# Patient Record
Sex: Female | Born: 2000 | Race: Black or African American | Hispanic: No | Marital: Single | Smoking: Former smoker
Health system: Southern US, Community
[De-identification: ages and names within clinical notes are randomized; demographics above are authoritative.]

## PROBLEM LIST (undated history)

## (undated) ENCOUNTER — Emergency Department (HOSPITAL_BASED_OUTPATIENT_CLINIC_OR_DEPARTMENT_OTHER): Admission: EM | Payer: BC Managed Care – PPO

## (undated) DIAGNOSIS — J020 Streptococcal pharyngitis: Secondary | ICD-10-CM

---
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
## 2021-03-03 DIAGNOSIS — R253 Fasciculation: Secondary | ICD-10-CM | POA: Diagnosis not present

## 2021-03-03 DIAGNOSIS — R000 Tachycardia, unspecified: Secondary | ICD-10-CM | POA: Diagnosis not present

## 2021-03-03 DIAGNOSIS — R0689 Other abnormalities of breathing: Secondary | ICD-10-CM | POA: Diagnosis not present

## 2021-03-03 DIAGNOSIS — R072 Precordial pain: Secondary | ICD-10-CM | POA: Diagnosis not present

## 2021-03-04 DIAGNOSIS — I498 Other specified cardiac arrhythmias: Secondary | ICD-10-CM | POA: Diagnosis not present

## 2021-03-11 ENCOUNTER — Emergency Department (HOSPITAL_BASED_OUTPATIENT_CLINIC_OR_DEPARTMENT_OTHER)
Admission: EM | Admit: 2021-03-11 | Discharge: 2021-03-11 | Disposition: A | Discharge status: Home / Self Care | Payer: BC Managed Care – PPO | Attending: Emergency Medicine | Admitting: Emergency Medicine

## 2021-03-11 ENCOUNTER — Other Ambulatory Visit: Payer: Self-pay

## 2021-03-11 ENCOUNTER — Emergency Department (HOSPITAL_BASED_OUTPATIENT_CLINIC_OR_DEPARTMENT_OTHER): Payer: BC Managed Care – PPO

## 2021-03-11 ENCOUNTER — Encounter (HOSPITAL_BASED_OUTPATIENT_CLINIC_OR_DEPARTMENT_OTHER): Payer: Self-pay | Admitting: *Deleted

## 2021-03-11 DIAGNOSIS — Y92410 Unspecified street and highway as the place of occurrence of the external cause: Secondary | ICD-10-CM | POA: Insufficient documentation

## 2021-03-11 DIAGNOSIS — S29011A Strain of muscle and tendon of front wall of thorax, initial encounter: Secondary | ICD-10-CM | POA: Diagnosis not present

## 2021-03-11 DIAGNOSIS — S299XXA Unspecified injury of thorax, initial encounter: Secondary | ICD-10-CM | POA: Diagnosis not present

## 2021-03-11 DIAGNOSIS — S098XXA Other specified injuries of head, initial encounter: Secondary | ICD-10-CM | POA: Insufficient documentation

## 2021-03-11 DIAGNOSIS — S20219A Contusion of unspecified front wall of thorax, initial encounter: Secondary | ICD-10-CM

## 2021-03-11 DIAGNOSIS — F17210 Nicotine dependence, cigarettes, uncomplicated: Secondary | ICD-10-CM | POA: Diagnosis not present

## 2021-03-11 DIAGNOSIS — R079 Chest pain, unspecified: Secondary | ICD-10-CM | POA: Diagnosis not present

## 2021-03-11 DIAGNOSIS — T148XXA Other injury of unspecified body region, initial encounter: Secondary | ICD-10-CM

## 2021-03-11 DIAGNOSIS — M7918 Myalgia, other site: Secondary | ICD-10-CM

## 2021-03-11 MED ORDER — CYCLOBENZAPRINE HCL 10 MG PO TABS
10.0000 mg | ORAL_TABLET | Freq: Once | ORAL | Status: AC
  Administered 2021-03-11: 10 mg via ORAL
  Filled 2021-03-11: qty 1

## 2021-03-11 MED ORDER — CYCLOBENZAPRINE HCL 10 MG PO TABS: 10.0000 mg | ORAL_TABLET | Freq: Two times a day (BID) | ORAL | 0 refills | Status: DC | PRN
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
## 2021-03-11 NOTE — Discharge Instructions (Addendum)
Recommend 1000 mg of Tylenol every 6 hours as needed for pain.  Recommend 800 mg ibuprofen every 8 hours as needed for pain.  Take muscle relaxant as prescribed but do not mix this with alcohol or drugs or driving or other dangerous activities as this medicine is sedating.  Overall suspect that you have muscle spasms and bone bruise.  No fracture on chest x-ray.

## 2021-03-23 ENCOUNTER — Other Ambulatory Visit: Payer: Self-pay

## 2021-03-23 ENCOUNTER — Emergency Department (HOSPITAL_BASED_OUTPATIENT_CLINIC_OR_DEPARTMENT_OTHER)
Admission: EM | Admit: 2021-03-23 | Discharge: 2021-03-23 | Disposition: A | Discharge status: Home / Self Care | Payer: BC Managed Care – PPO | Attending: Emergency Medicine | Admitting: Emergency Medicine

## 2021-03-23 ENCOUNTER — Encounter (HOSPITAL_BASED_OUTPATIENT_CLINIC_OR_DEPARTMENT_OTHER): Payer: Self-pay

## 2021-03-23 DIAGNOSIS — R509 Fever, unspecified: Secondary | ICD-10-CM | POA: Diagnosis not present

## 2021-03-23 DIAGNOSIS — J039 Acute tonsillitis, unspecified: Secondary | ICD-10-CM | POA: Diagnosis not present

## 2021-03-23 DIAGNOSIS — R000 Tachycardia, unspecified: Secondary | ICD-10-CM | POA: Insufficient documentation

## 2021-03-23 DIAGNOSIS — F17290 Nicotine dependence, other tobacco product, uncomplicated: Secondary | ICD-10-CM | POA: Insufficient documentation

## 2021-03-23 LAB — GROUP A STREP BY PCR: Group A Strep by PCR: NOT DETECTED

## 2021-03-23 MED ORDER — IBUPROFEN 100 MG/5ML PO SUSP
800.0000 mg | Freq: Once | ORAL | Status: AC
  Administered 2021-03-23: 800 mg via ORAL
  Filled 2021-03-23: qty 40

## 2021-03-23 MED ORDER — CLINDAMYCIN HCL 150 MG PO CAPS
300.0000 mg | ORAL_CAPSULE | Freq: Once | ORAL | Status: AC
  Administered 2021-03-23: 300 mg via ORAL
  Filled 2021-03-23: qty 2

## 2021-03-23 MED ORDER — LIDOCAINE VISCOUS HCL 2 % MT SOLN
15.0000 mL | Freq: Once | OROMUCOSAL | Status: AC
  Administered 2021-03-23: 15 mL via OROMUCOSAL
  Filled 2021-03-23: qty 15

## 2021-03-23 MED ORDER — DEXAMETHASONE SODIUM PHOSPHATE 10 MG/ML IJ SOLN
10.0000 mg | Freq: Once | INTRAMUSCULAR | Status: AC
  Administered 2021-03-23: 10 mg
  Filled 2021-03-23: qty 1

## 2021-03-23 MED ORDER — CLINDAMYCIN HCL 300 MG PO CAPS: 300.0000 mg | ORAL_CAPSULE | Freq: Three times a day (TID) | ORAL | 0 refills | Status: AC

## 2021-03-23 NOTE — Discharge Instructions (Addendum)
Your strep test today is negative. Home to rest and push hydrating fluids. Take Motrin and Tylenol as needed as directed. Take clindamycin as prescribed. Return to emergency room for worsening or concerning symptoms. Recheck with your doctor if symptoms or not improving.  Consider mono test on day 10 if not improving.

## 2021-03-23 NOTE — ED Provider Notes (Signed)
[facility] EMERGENCY DEPARTMENT Provider Note   CSN: [id number] Arrival date & time: 03/23/21  1747     History Chief Complaint  Patient presents with   Generalized Body Aches   Sore Throat    Robin Marshall is a 20 y.o. female.  20 year old female with complaint of sore throat with fever and tender cervical LNs onset yesterday. Denies cough, congestion, headache, nausea, vomiting. No known sick contacts. No other complaints or concerns.       Past Medical History:  Diagnosis Date   Strep pharyngitis     There are no problems to display for this patient.   History reviewed. No pertinent surgical history.   OB History   No obstetric history on file.     No family history on file.  Social History   Tobacco Use   Smoking status: Every Day    Types: E-cigarettes   Smokeless tobacco: Never  Vaping Use   Vaping Use: Every day  Substance Use Topics   Alcohol use: Yes    Comment: occ   Drug use: Yes    Types: Marijuana    Comment: last use 07/22/20    Home Medications Prior to Admission medications   Medication Sig Start Date End Date Taking? Authorizing Provider  clindamycin (CLEOCIN) 300 MG capsule Take 1 capsule (300 mg total) by mouth 3 (three) times daily for 7 days. 03/23/21 03/30/21 Yes Jeannie Fend, PA-C  albuterol (VENTOLIN HFA) 108 (90 Base) MCG/ACT inhaler Inhale 2 puffs into the lungs every 4 (four) hours as needed for wheezing or shortness of breath. 07/25/20   Molpus, John, MD  cyclobenzaprine (FLEXERIL) 10 MG tablet Take 1 tablet (10 mg total) by mouth 2 (two) times daily as needed for muscle spasms. 03/11/21   Curatolo, Adam, DO  naproxen (NAPROSYN) 500 MG tablet Take 1 tablet twice daily as needed for fever or body aches. 07/25/20   Molpus, John, MD  diphenhydrAMINE (BENADRYL) 25 MG tablet Take 1 tablet (25 mg total) by mouth every 6 (six) hours. 10/12/16 07/25/20  Emi Holes, PA-C    Allergies    Patient has no known  allergies.  Review of Systems   Review of Systems  Constitutional:  Positive for chills and fever.  HENT:  Positive for sore throat and voice change. Negative for congestion and trouble swallowing.   Respiratory:  Negative for cough.   Gastrointestinal:  Negative for nausea and vomiting.  Musculoskeletal:  Negative for arthralgias, myalgias, neck pain and neck stiffness.  Skin:  Negative for rash and wound.  Allergic/Immunologic: Negative for immunocompromised state.  Neurological:  Negative for headaches.  Hematological:  Positive for adenopathy.  All other systems reviewed and are negative.  Physical Exam Updated Vital Signs BP 106/69 (BP Location: Left Arm)   Pulse 88   Temp 98.4 F (36.9 C) (Oral)   Resp 16   Wt 71.7 kg   LMP 03/03/2021   SpO2 100%   BMI 28.90 kg/m   Physical Exam Vitals and nursing note reviewed.  Constitutional:      General: She is not in acute distress.    Appearance: She is well-developed. She is not diaphoretic.  HENT:     Head: Normocephalic and atraumatic.     Jaw: No trismus.     Right Ear: Tympanic membrane and ear canal normal.     Left Ear: Tympanic membrane and ear canal normal.     Nose: No congestion.  Mouth/Throat:     Mouth: Mucous membranes are moist.     Pharynx: Uvula midline. No uvula swelling.     Tonsils: Tonsillar exudate present. No tonsillar abscesses. 2+ on the right. 2+ on the left.  Cardiovascular:     Rate and Rhythm: Regular rhythm. Tachycardia present.     Heart sounds: Normal heart sounds.  Pulmonary:     Effort: Pulmonary effort is normal.     Breath sounds: Normal breath sounds.  Lymphadenopathy:     Cervical: Cervical adenopathy present.  Skin:    General: Skin is warm and dry.     Findings: No erythema or rash.  Neurological:     Mental Status: She is alert and oriented to person, place, and time.  Psychiatric:        Behavior: Behavior normal.    ED Results / Procedures / Treatments   Labs (all  labs ordered are listed, but only abnormal results are displayed) Labs Reviewed  GROUP A STREP BY PCR    EKG None  Radiology No results found.  Procedures Procedures   Medications Ordered in ED Medications  ibuprofen (ADVIL) 100 MG/5ML suspension 800 mg (800 mg Oral Given 03/23/21 1804)  dexamethasone (DECADRON) injection 10 mg (10 mg Other Given 03/23/21 1854)  lidocaine (XYLOCAINE) 2 % viscous mouth solution 15 mL (15 mLs Mouth/Throat Given 03/23/21 1854)  clindamycin (CLEOCIN) capsule 300 mg (300 mg Oral Given 03/23/21 2055)    ED Course  I have reviewed the triage vital signs and the nursing notes.  Pertinent labs & imaging results that were available during my care of the patient were reviewed by me and considered in my medical decision making (see chart for details).  Clinical Course as of 03/23/21 2106  Tue Mar 23, 2021  6752 20 year old female with sore throat, fever, tender anterior cervical LNs. Tonsils enlarged with exudate. Uvula midline. Does not appear to have PTA. Lungs CTA. Given motrin for fever in triage. Decadron and viscous lidocaine ordered. Strep negative. Plan is to recheck, if pain improved, likely dc with antibiotics for tonsillits.  [LM]  2103 Vital signs improved, no longer febrile or tachycardic.  Symptoms have improved.  Given first dose of clindamycin in the ED, advised to recheck with PCP, consider mono test if not improving.  [LM]    Clinical Course User Index [LM] Alden Hipp   MDM Rules/Calculators/A&P                           Final Clinical Impression(s) / ED Diagnoses Final diagnoses:  Acute tonsillitis, unspecified etiology    Rx / DC Orders ED Discharge Orders          Ordered    clindamycin (CLEOCIN) 300 MG capsule  3 times daily        03/23/21 2043             Jeannie Fend, PA-C 03/23/21 2106    Terrilee Files, MD 03/24/21 1140

## 2021-03-23 NOTE — ED Triage Notes (Signed)
Pt c/o body aches, sore throat x 2 days-NAD-steady gait

## 2021-03-23 NOTE — ED Notes (Signed)
Patient given fluids with instructions to sip slowly.

## 2021-11-08 ENCOUNTER — Other Ambulatory Visit: Payer: Self-pay

## 2021-11-08 ENCOUNTER — Encounter (HOSPITAL_BASED_OUTPATIENT_CLINIC_OR_DEPARTMENT_OTHER): Payer: Self-pay

## 2021-11-08 ENCOUNTER — Emergency Department (HOSPITAL_BASED_OUTPATIENT_CLINIC_OR_DEPARTMENT_OTHER)
Admission: EM | Admit: 2021-11-08 | Discharge: 2021-11-08 | Disposition: A | Discharge status: Home / Self Care | Payer: BC Managed Care – PPO | Attending: Emergency Medicine | Admitting: Emergency Medicine

## 2021-11-08 DIAGNOSIS — R070 Pain in throat: Secondary | ICD-10-CM | POA: Diagnosis not present

## 2021-11-08 DIAGNOSIS — J029 Acute pharyngitis, unspecified: Secondary | ICD-10-CM | POA: Diagnosis not present

## 2021-11-08 LAB — GROUP A STREP BY PCR: Group A Strep by PCR: NOT DETECTED

## 2021-11-08 MED ORDER — DEXAMETHASONE 10 MG/ML FOR PEDIATRIC ORAL USE
10.0000 mg | Freq: Once | INTRAMUSCULAR | Status: AC
  Administered 2021-11-08: 10 mg via ORAL
  Filled 2021-11-08: qty 1

## 2021-11-08 MED ORDER — DEXAMETHASONE 1 MG/ML PO CONC
10.0000 mg | Freq: Once | ORAL | Status: DC
  Filled 2021-11-08: qty 10

## 2021-11-08 MED ORDER — LIDOCAINE VISCOUS HCL 2 % MT SOLN
15.0000 mL | Freq: Once | OROMUCOSAL | Status: AC
  Administered 2021-11-08: 15 mL via OROMUCOSAL
  Filled 2021-11-08: qty 15

## 2021-11-08 NOTE — ED Triage Notes (Signed)
Patient presents with complaint of sore throat x1 day. ?

## 2021-11-08 NOTE — ED Provider Notes (Signed)
? ?[facility] EMERGENCY DEPARTMENT  ?Provider Note ? ?CSN: [id number] ?Arrival date & time: 11/08/21 0445 ? ?History ?Chief Complaint  ?Patient presents with  ? Sore Throat  ? ? ?Robin Marshall is a 21 y.o. female reports two days of sore throat, worse with swallowing, associated with subjective fever at home. Hurts to talk. Arrived by EMS who report no difficulty handling secretions.  ? ? ?Home Medications ?Prior to Admission medications   ?Medication Sig Start Date End Date Taking? Authorizing Provider  ?albuterol (VENTOLIN HFA) 108 (90 Base) MCG/ACT inhaler Inhale 2 puffs into the lungs every 4 (four) hours as needed for wheezing or shortness of breath. 07/25/20   Molpus, John, MD  ?cyclobenzaprine (FLEXERIL) 10 MG tablet Take 1 tablet (10 mg total) by mouth 2 (two) times daily as needed for muscle spasms. 03/11/21   Curatolo, Adam, DO  ?naproxen (NAPROSYN) 500 MG tablet Take 1 tablet twice daily as needed for fever or body aches. 07/25/20   Molpus, John, MD  ?diphenhydrAMINE (BENADRYL) 25 MG tablet Take 1 tablet (25 mg total) by mouth every 6 (six) hours. 10/12/16 07/25/20  Emi Holes, PA-C  ? ? ? ?Allergies    ?Patient has no known allergies. ? ? ?Review of Systems   ?Review of Systems ?Please see HPI for pertinent positives and negatives ? ?Physical Exam ?BP 122/79 (BP Location: Right Arm)   Pulse 99   Temp 98.4 ?F (36.9 ?C) (Oral)   Resp 16   Ht 5\' 2"  (1.575 m)   Wt 73.1 kg   LMP 10/21/2021 (Exact Date)   SpO2 100%   BMI 29.47 kg/m?  ? ?Physical Exam ?Vitals and nursing note reviewed.  ?HENT:  ?   Head: Normocephalic.  ?   Nose: Nose normal.  ?   Mouth/Throat:  ?   Mouth: Mucous membranes are moist.  ?   Pharynx: Uvula midline. Posterior oropharyngeal erythema present. No oropharyngeal exudate or uvula swelling.  ?   Tonsils: No tonsillar exudate or tonsillar abscesses. 2+ on the right. 2+ on the left.  ?Eyes:  ?   Extraocular Movements: Extraocular movements intact.  ?Pulmonary:  ?   Effort:  Pulmonary effort is normal.  ?Musculoskeletal:     ?   General: Normal range of motion.  ?   Cervical back: Neck supple.  ?Skin: ?   Findings: No rash (on exposed skin).  ?Neurological:  ?   Mental Status: She is alert and oriented to person, place, and time.  ?Psychiatric:     ?   Mood and Affect: Mood normal.  ? ? ?ED Results / Procedures / Treatments   ?EKG ?None ? ?Procedures ?Procedures ? ?Medications Ordered in the ED ?Medications  ?dexamethasone (DECADRON) 1 MG/ML solution 10 mg (has no administration in time range)  ?lidocaine (XYLOCAINE) 2 % viscous mouth solution 15 mL (15 mLs Mouth/Throat Given 11/08/21 0458)  ? ? ?Initial Impression and Plan ? Patient here with sore throat. No acute distress. Not febrile. No clinical signs of abscess. Strep PCR pending. Lidocaine for discomfort.  ? ?ED Course  ? ?Clinical Course as of 11/08/21 0535  ?Mon Nov 08, 2021  ?0531 Strep is negative. Plan decadron for swelling. No indication for antibiotics. Symptomatic care at home. PCP follow up.  [CS]  ?  ?Clinical Course User Index ?[CS] Nov 10, 2021, MD  ? ? ? ?MDM Rules/Calculators/A&P ?Medical Decision Making ?Problems Addressed: ?Pharyngitis, unspecified etiology: acute illness or injury ? ?Risk ?Prescription drug management. ? ? ? ?  Final Clinical Impression(s) / ED Diagnoses ?Final diagnoses:  ?Pharyngitis, unspecified etiology  ? ? ?Rx / DC Orders ?ED Discharge Orders   ? ? None  ? ?  ? ?  ?Robin Savoy, MD ?11/08/21 [phone] ? ?

## 2021-11-08 NOTE — ED Notes (Signed)
Patient discharged to home.  All discharge instructions reviewed.  Patient verbalized understanding via teachback method.  VS WDL.  Respirations even and unlabored.  Ambulatory out of ED.   °

## 2021-12-22 ENCOUNTER — Encounter (HOSPITAL_BASED_OUTPATIENT_CLINIC_OR_DEPARTMENT_OTHER): Payer: Self-pay

## 2021-12-22 ENCOUNTER — Emergency Department (HOSPITAL_BASED_OUTPATIENT_CLINIC_OR_DEPARTMENT_OTHER)
Admission: EM | Admit: 2021-12-22 | Discharge: 2021-12-22 | Disposition: A | Discharge status: Home / Self Care | Payer: BC Managed Care – PPO | Attending: Emergency Medicine | Admitting: Emergency Medicine

## 2021-12-22 DIAGNOSIS — N760 Acute vaginitis: Secondary | ICD-10-CM | POA: Insufficient documentation

## 2021-12-22 DIAGNOSIS — N764 Abscess of vulva: Secondary | ICD-10-CM | POA: Diagnosis not present

## 2021-12-22 DIAGNOSIS — Z202 Contact with and (suspected) exposure to infections with a predominantly sexual mode of transmission: Secondary | ICD-10-CM | POA: Insufficient documentation

## 2021-12-22 DIAGNOSIS — N3000 Acute cystitis without hematuria: Secondary | ICD-10-CM | POA: Insufficient documentation

## 2021-12-22 DIAGNOSIS — D72829 Elevated white blood cell count, unspecified: Secondary | ICD-10-CM | POA: Diagnosis not present

## 2021-12-22 DIAGNOSIS — B9689 Other specified bacterial agents as the cause of diseases classified elsewhere: Secondary | ICD-10-CM | POA: Insufficient documentation

## 2021-12-22 DIAGNOSIS — F17290 Nicotine dependence, other tobacco product, uncomplicated: Secondary | ICD-10-CM | POA: Diagnosis not present

## 2021-12-22 DIAGNOSIS — L0291 Cutaneous abscess, unspecified: Secondary | ICD-10-CM

## 2021-12-22 LAB — CBC WITH DIFFERENTIAL/PLATELET
Abs Immature Granulocytes: 0.06 10*3/uL (ref 0.00–0.07)
Basophils Absolute: 0 10*3/uL (ref 0.0–0.1)
Basophils Relative: 0 %
Eosinophils Absolute: 0.1 10*3/uL (ref 0.0–0.5)
Eosinophils Relative: 1 %
HCT: 31.4 % — ABNORMAL LOW (ref 36.0–46.0)
Hemoglobin: 9.1 g/dL — ABNORMAL LOW (ref 12.0–15.0)
Immature Granulocytes: 1 %
Lymphocytes Relative: 21 %
Lymphs Abs: 2.8 10*3/uL (ref 0.7–4.0)
MCH: 22.1 pg — ABNORMAL LOW (ref 26.0–34.0)
MCHC: 29 g/dL — ABNORMAL LOW (ref 30.0–36.0)
MCV: 76.4 fL — ABNORMAL LOW (ref 80.0–100.0)
Monocytes Absolute: 1 10*3/uL (ref 0.1–1.0)
Monocytes Relative: 8 %
Neutro Abs: 9.1 10*3/uL — ABNORMAL HIGH (ref 1.7–7.7)
Neutrophils Relative %: 69 %
Platelets: 435 10*3/uL — ABNORMAL HIGH (ref 150–400)
RBC: 4.11 MIL/uL (ref 3.87–5.11)
RDW: 18.5 % — ABNORMAL HIGH (ref 11.5–15.5)
WBC: 13 10*3/uL — ABNORMAL HIGH (ref 4.0–10.5)
nRBC: 0 % (ref 0.0–0.2)

## 2021-12-22 LAB — HCG, QUANTITATIVE, PREGNANCY: hCG, Beta Chain, Quant, S: <1 m[IU]/mL (ref <5)

## 2021-12-22 LAB — URINALYSIS, MICROSCOPIC (REFLEX)

## 2021-12-22 LAB — PREGNANCY, URINE: Preg Test, Ur: NEGATIVE

## 2021-12-22 LAB — WET PREP, GENITAL
Sperm: NONE SEEN
Trich, Wet Prep: NONE SEEN
WBC, Wet Prep HPF POC: >=10 — AB (ref <10)
Yeast Wet Prep HPF POC: NONE SEEN

## 2021-12-22 LAB — URINALYSIS, ROUTINE W REFLEX MICROSCOPIC
Bilirubin Urine: NEGATIVE
Glucose, UA: NEGATIVE mg/dL
Hgb urine dipstick: NEGATIVE
Ketones, ur: NEGATIVE mg/dL
Nitrite: POSITIVE — AB
Protein, ur: 30 mg/dL — AB
Specific Gravity, Urine: 1.02 (ref 1.005–1.030)
pH: 7 (ref 5.0–8.0)

## 2021-12-22 LAB — COMPREHENSIVE METABOLIC PANEL
ALT: 21 U/L (ref 0–44)
AST: 30 U/L (ref 15–41)
Albumin: 4 g/dL (ref 3.5–5.0)
Alkaline Phosphatase: 73 U/L (ref 38–126)
Anion gap: 9 (ref 5–15)
BUN: 8 mg/dL (ref 6–20)
CO2: 26 mmol/L (ref 22–32)
Calcium: 10.4 mg/dL — ABNORMAL HIGH (ref 8.9–10.3)
Chloride: 101 mmol/L (ref 98–111)
Creatinine, Ser: 0.9 mg/dL (ref 0.44–1.00)
GFR, Estimated: >60 mL/min (ref >60)
Glucose, Bld: 97 mg/dL (ref 70–99)
Potassium: 3.5 mmol/L (ref 3.5–5.1)
Sodium: 136 mmol/L (ref 135–145)
Total Bilirubin: 0.5 mg/dL (ref 0.3–1.2)
Total Protein: 8.8 g/dL — ABNORMAL HIGH (ref 6.5–8.1)

## 2021-12-22 MED ORDER — CEPHALEXIN 500 MG PO CAPS
500.0000 mg | ORAL_CAPSULE | Freq: Four times a day (QID) | ORAL | 0 refills | Status: DC
Start: 2021-12-22 — End: 2022-07-26

## 2021-12-22 MED ORDER — METRONIDAZOLE 500 MG PO TABS: 500.0000 mg | ORAL_TABLET | Freq: Two times a day (BID) | ORAL | 0 refills | Status: DC

## 2021-12-22 MED ORDER — LIDOCAINE HCL (PF) 1 % IJ SOLN
5.0000 mL | Freq: Once | INTRAMUSCULAR | Status: DC
  Filled 2021-12-22: qty 5

## 2021-12-22 MED ORDER — CEFTRIAXONE SODIUM 500 MG IJ SOLR
500.0000 mg | Freq: Once | INTRAMUSCULAR | Status: AC
  Administered 2021-12-22: 500 mg via INTRAMUSCULAR
  Filled 2021-12-22: qty 500

## 2021-12-22 MED ORDER — DOXYCYCLINE HYCLATE 100 MG PO CAPS
100.0000 mg | ORAL_CAPSULE | Freq: Two times a day (BID) | ORAL | 0 refills | Status: DC
Start: 2021-12-22 — End: 2022-07-26

## 2021-12-22 NOTE — ED Notes (Signed)
Swollen small marble size lump to rt labia, pt states it was small and then she pricked it with a needle yesterday , now more swollen and tender

## 2021-12-22 NOTE — ED Notes (Signed)
Pt A&OX4 ambulatory at d/c with independent steady gait 

## 2021-12-22 NOTE — ED Provider Notes (Signed)
[facility] EMERGENCY DEPARTMENT Provider Note   CSN: [id number] Arrival date & time: 12/22/21  1601     History  Chief Complaint  Patient presents with   Abscess    Robin Marshall is a 21 y.o. female.   Abscess Associated symptoms: no fever and no vomiting    21 year old female presents emergency department with complaints of abscess on her "right vagina lip."  She said she noticed getting ingrown hairs in this area after shaving, but they have never gotten this bad before. She first noticed this area develop about 5 days ago.   It is painful to the touch and with certain movement..  She also is concerned about potential STD exposure with chlamydia specifically in mind and request she be tested for everything although she is having no noticeable vaginal discharge at the moment. She wants to be tested for everything sexually transmittable. She currently does not follow with an OBGYN or PCP. Denies fever, chills, night sweats, chest pain, shortness of breath, abdominal pain, N/V/D, urinary/other vaginal symptoms, change in bowel habits.    Home Medications Prior to Admission medications   Medication Sig Start Date End Date Taking? Authorizing Provider  cephALEXin (KEFLEX) 500 MG capsule Take 1 capsule (500 mg total) by mouth 4 (four) times daily. 12/22/21  Yes Sherian Maroon A, PA  doxycycline (VIBRAMYCIN) 100 MG capsule Take 1 capsule (100 mg total) by mouth 2 (two) times daily. 12/22/21  Yes Sherian Maroon A, PA  metroNIDAZOLE (FLAGYL) 500 MG tablet Take 1 tablet (500 mg total) by mouth 2 (two) times daily. 12/22/21  Yes Sherian Maroon A, PA  albuterol (VENTOLIN HFA) 108 (90 Base) MCG/ACT inhaler Inhale 2 puffs into the lungs every 4 (four) hours as needed for wheezing or shortness of breath. 07/25/20   Molpus, John, MD  cyclobenzaprine (FLEXERIL) 10 MG tablet Take 1 tablet (10 mg total) by mouth 2 (two) times daily as needed for muscle spasms. 03/11/21   Curatolo, Adam, DO   naproxen (NAPROSYN) 500 MG tablet Take 1 tablet twice daily as needed for fever or body aches. 07/25/20   Molpus, John, MD  diphenhydrAMINE (BENADRYL) 25 MG tablet Take 1 tablet (25 mg total) by mouth every 6 (six) hours. 10/12/16 07/25/20  Emi Holes, PA-C      Allergies    Patient has no known allergies.    Review of Systems   Review of Systems  Constitutional:  Negative for chills and fever.  HENT:  Negative for ear pain and sore throat.   Eyes:  Negative for pain and visual disturbance.  Respiratory:  Negative for cough and shortness of breath.   Cardiovascular:  Negative for chest pain and palpitations.  Gastrointestinal:  Negative for abdominal pain and vomiting.  Genitourinary:  Positive for genital sores and vaginal pain. Negative for difficulty urinating, dyspareunia, dysuria, flank pain, frequency, hematuria, menstrual problem, pelvic pain, urgency, vaginal bleeding and vaginal discharge.  Musculoskeletal:  Negative for arthralgias and back pain.  Skin:  Negative for color change and rash.  Neurological:  Negative for seizures and syncope.  All other systems reviewed and are negative.  Physical Exam Updated Vital Signs BP 98/62   Pulse 99   Temp 99.8 F (37.7 C) (Oral)   Resp 17   Ht  (1.575 m)   LMP 12/19/2021   SpO2 100%   BMI 29.47 kg/m  Physical Exam Vitals and nursing note reviewed. Exam conducted with a chaperone present.  Constitutional:  General: She is not in acute distress.    Appearance: Normal appearance. She is well-developed and normal weight. She is not ill-appearing.  HENT:     Head: Normocephalic and atraumatic.     Mouth/Throat:     Mouth: Mucous membranes are moist.     Pharynx: Oropharynx is clear.  Eyes:     General:        Right eye: No discharge.        Left eye: No discharge.     Extraocular Movements: Extraocular movements intact.     Conjunctiva/sclera: Conjunctivae normal.  Cardiovascular:     Rate and Rhythm: Normal  rate and regular rhythm.     Pulses: Normal pulses.     Heart sounds: Normal heart sounds. No murmur heard. Pulmonary:     Effort: Pulmonary effort is normal. No respiratory distress.     Breath sounds: Normal breath sounds. No wheezing.  Abdominal:     General: Abdomen is flat.     Palpations: Abdomen is soft.     Tenderness: There is no abdominal tenderness. There is no right CVA tenderness, left CVA tenderness or guarding.  Genitourinary:    Exam position: Lithotomy position.     Labia:        Right: Tenderness present. No rash.      Vagina: No signs of injury and foreign body. Vaginal discharge present. No erythema, bleeding or lesions.     Cervix: Discharge present. No cervical motion tenderness, friability, erythema, cervical bleeding or eversion.     Uterus: Normal. Not tender.      Adnexa: Right adnexa normal and left adnexa normal.       Right: No tenderness.         Left: No tenderness.         Comments: R external labia show 2.5cm x 3.5cm area of fluctuance. No surrounding erythema noted. TTP of area noted. No obvious signs of drainage from site.  White thin discharge noted in vaginal vault with thicker white discharge from cervical os. No CMT or adnexal tenderness. No bleeding noted on exam.    Musculoskeletal:        General: No swelling. Normal range of motion.     Cervical back: Neck supple.     Right lower leg: No edema.     Left lower leg: No edema.  Skin:    General: Skin is warm and dry.     Capillary Refill: Capillary refill takes less than 2 seconds.  Neurological:     General: No focal deficit present.     Mental Status: She is alert and oriented to person, place, and time.  Psychiatric:        Mood and Affect: Mood normal.        Behavior: Behavior normal.    ED Results / Procedures / Treatments   Labs (all labs ordered are listed, but only abnormal results are displayed) Labs Reviewed  WET PREP, GENITAL - Abnormal; Notable for the following  components:      Result Value   Clue Cells Wet Prep HPF POC PRESENT (*)    WBC, Wet Prep HPF POC >=10 (*)    All other components within normal limits  URINALYSIS, ROUTINE W REFLEX MICROSCOPIC - Abnormal; Notable for the following components:   APPearance HAZY (*)    Protein, ur 30 (*)    Nitrite POSITIVE (*)    Leukocytes,Ua SMALL (*)    All other components within normal limits  CBC WITH DIFFERENTIAL/PLATELET - Abnormal; Notable for the following components:   WBC 13.0 (*)    Hemoglobin 9.1 (*)    HCT 31.4 (*)    MCV 76.4 (*)    MCH 22.1 (*)    MCHC 29.0 (*)    RDW 18.5 (*)    Platelets 435 (*)    Neutro Abs 9.1 (*)    All other components within normal limits  COMPREHENSIVE METABOLIC PANEL - Abnormal; Notable for the following components:   Calcium 10.4 (*)    Total Protein 8.8 (*)    All other components within normal limits  URINALYSIS, MICROSCOPIC (REFLEX) - Abnormal; Notable for the following components:   Bacteria, UA MANY (*)    All other components within normal limits  HIV ANTIBODY (ROUTINE TESTING W REFLEX)  HCG, QUANTITATIVE, PREGNANCY  PREGNANCY, URINE  GC/CHLAMYDIA PROBE AMP ([place]) NOT AT [facility]    EKG None  Radiology No results found.  Procedures .Marland KitchenIncision and Drainage  Date/Time: 12/23/2021 1:21 PM Performed by: Peter Garter, PA Authorized by: Peter Garter, PA   Consent:    Consent obtained:  Verbal   Consent given by:  Patient   Risks, benefits, and alternatives were discussed: yes     Risks discussed:  Bleeding, incomplete drainage, pain, infection and damage to other organs   Alternatives discussed:  No treatment, delayed treatment, referral and alternative treatment Universal protocol:    Procedure explained and questions answered to patient or proxy's satisfaction: yes     Patient identity confirmed:  Verbally with patient Location:    Type:  Abscess   Location:  Anogenital   Anogenital location:  Vulva Pre-procedure  details:    Skin preparation:  Chlorhexidine with alcohol Sedation:    Sedation type:  None Anesthesia:    Anesthesia method:  Local infiltration   Local anesthetic:  Lidocaine 1% w/o epi Procedure type:    Complexity:  Simple Procedure details:    Ultrasound guidance: no     Needle aspiration: no     Incision types:  Single straight   Wound management:  Probed and deloculated and irrigated with saline   Drainage:  Purulent and bloody   Drainage amount:  Copious   Wound treatment:  Wound left open   Packing materials:  None Post-procedure details:    Procedure completion:  Tolerated well, no immediate complications    Medications Ordered in ED Medications  cefTRIAXone (ROCEPHIN) injection 500 mg (500 mg Intramuscular Given 12/22/21 1932)    ED Course/ Medical Decision Making/ A&P                           Medical Decision Making Amount and/or Complexity of Data Reviewed Labs: ordered.  Risk Prescription drug management.   This patient presents to the ED for concern of abscess, this involves an extensive number of treatment options, and is a complaint that carries with it a high risk of complications and morbidity.  The differential diagnosis includes abscess, STD, UTI, pregnancy, PID,   Co morbidities that complicate the patient evaluation  Unprotected sex with possible STD exposure     Lab Tests:  I Ordered, and personally interpreted labs.  The pertinent results include:   UA significant for UTI w/ nitrites, small leukocytes, many bacteria Wet prep indicative of BV WBC of 13 w/o left shift with Hgb of 9.1   Imaging Studies ordered:  N/a   Cardiac Monitoring: / EKG:  The patient was maintained on a cardiac monitor.  I personally viewed and interpreted the cardiac monitored which showed an underlying rhythm of: sinus rhythm   Consultations Obtained:  N/a   Problem List / ED Course / Critical interventions / Medication management  Chlamydia  exposure I ordered medication including rocephin  for abx therapy  Reevaluation of the patient after these medicines showed that the patient improved I have reviewed the patients home medicines and have made adjustments as needed   Social Determinants of Health:  E-cigarette user   Test / Admission - Considered:  BV/UTI/Abscess/Chlamydia exposure Vitals signs within normal range and stable throughout visit. Laboratory/imaging studies significant for: UTI, BV, anemia, pending GC/Chlamydia Patient treated with rocephin in ED and given 7 days of doxycycline to take given chlamydial exposure. She was advised to follow mychart for results and can d/c this abx if negative Given flagyl to take for BV coverage and keflex for antibacterial coverage involving UTI/abscess.  Encouraged establishing care with OBGYN and PCP for further evaluation and treatment.  Worrisome signs and symptoms were discussed with the patient, and the patient acknowledged understanding to return to the ED if noticed. Patient was stable upon discharge.          Final Clinical Impression(s) / ED Diagnoses Final diagnoses:  Abscess  Acute cystitis without hematuria  Bacterial vaginosis  Exposure to chlamydia    Rx / DC Orders ED Discharge Orders          Ordered    metroNIDAZOLE (FLAGYL) 500 MG tablet  2 times daily        12/22/21 1924    doxycycline (VIBRAMYCIN) 100 MG capsule  2 times daily        12/22/21 1924    cephALEXin (KEFLEX) 500 MG capsule  4 times daily        12/22/21 1924              Peter Garter, [place] 12/23/21 1325    Franne Forts, DO 12/26/21 1831

## 2021-12-22 NOTE — ED Notes (Signed)
Chaperone for Pelvic exam and I&D of abscess to labia.  Pt tolerated well. Provider gave instructions for wound care

## 2021-12-22 NOTE — Discharge Instructions (Addendum)
Keep area where drainage was performed clean dry.  Wash with warm soapy water frequently.  Consider getting baby wipes or some other kind of leg you clean yourself every time use the bathroom to keep the area clean.  Keep area covered with Band-Aid or other type of bandaging.  Note that infection can recur in the area so please come back if you notice the signs and symptoms we talked about. He also had a urinary tract infection.  We will treat this with antibiotics. One of the swabs we did show positive for bacterial vaginosis.  We will treat this with antibiotics. We have treated you empirically for chlamydia as you expressed concern.  There are 3 antibiotics you should take:  1 is called metronidazole or Flagyl.  Take as directed for the next 7 days; it is for bacterial vaginosis. The second is called Keflex.  It is antibiotic for your urinary tract infection.  Take as directed for the next 5 days. The third antibiotic is called doxycycline.  You should take this twice a day for the next 7 days.  It is another antibiotic for chlamydia coverage. Follow your MyChart for your GC/chlamydia test results.  You can stop taking the doxycycline for GC/chlamydia results are negative.  Consider establishing care with OB/GYN for further gynecological issues.  Given the fact you are sexually active, establish care with a OB/GYN would be very beneficial. Please advise states return to emergency department for worrisome signs and symptoms we discussed become apparent.

## 2021-12-22 NOTE — ED Notes (Signed)
Pt verbalized understanding of d/c instructions, prescriptions and follow up care.

## 2021-12-22 NOTE — ED Triage Notes (Signed)
Pt reports abscess to right labia x5 days. Initially area small but picked with a needle and now is swollen and painful

## 2021-12-23 LAB — GC/CHLAMYDIA PROBE AMP (~~LOC~~) NOT AT ARMC
Chlamydia: POSITIVE — AB
Comment: NEGATIVE
Comment: NORMAL
Neisseria Gonorrhea: POSITIVE — AB

## 2021-12-23 LAB — HIV ANTIBODY (ROUTINE TESTING W REFLEX): HIV Screen 4th Generation wRfx: NONREACTIVE

## 2022-07-26 ENCOUNTER — Encounter (HOSPITAL_BASED_OUTPATIENT_CLINIC_OR_DEPARTMENT_OTHER): Payer: Self-pay | Admitting: Emergency Medicine

## 2022-07-26 ENCOUNTER — Emergency Department (HOSPITAL_BASED_OUTPATIENT_CLINIC_OR_DEPARTMENT_OTHER)
Admission: EM | Admit: 2022-07-26 | Discharge: 2022-07-26 | Disposition: A | Discharge status: Home / Self Care | Payer: BC Managed Care – PPO | Attending: Student | Admitting: Student

## 2022-07-26 ENCOUNTER — Other Ambulatory Visit: Payer: Self-pay

## 2022-07-26 DIAGNOSIS — N898 Other specified noninflammatory disorders of vagina: Secondary | ICD-10-CM | POA: Diagnosis not present

## 2022-07-26 DIAGNOSIS — N390 Urinary tract infection, site not specified: Secondary | ICD-10-CM | POA: Diagnosis not present

## 2022-07-26 DIAGNOSIS — A630 Anogenital (venereal) warts: Secondary | ICD-10-CM | POA: Insufficient documentation

## 2022-07-26 DIAGNOSIS — Z87891 Personal history of nicotine dependence: Secondary | ICD-10-CM | POA: Insufficient documentation

## 2022-07-26 LAB — URINALYSIS, MICROSCOPIC (REFLEX): WBC, UA: >50 WBC/hpf (ref 0–5)

## 2022-07-26 LAB — URINALYSIS, ROUTINE W REFLEX MICROSCOPIC
Bilirubin Urine: NEGATIVE
Glucose, UA: NEGATIVE mg/dL
Ketones, ur: NEGATIVE mg/dL
Nitrite: NEGATIVE
Protein, ur: NEGATIVE mg/dL
Specific Gravity, Urine: >=1.03 (ref 1.005–1.030)
pH: 6 (ref 5.0–8.0)

## 2022-07-26 LAB — WET PREP, GENITAL
Sperm: NONE SEEN
Trich, Wet Prep: NONE SEEN
WBC, Wet Prep HPF POC: >=10 — AB (ref <10)
Yeast Wet Prep HPF POC: NONE SEEN

## 2022-07-26 LAB — PREGNANCY, URINE: Preg Test, Ur: NEGATIVE

## 2022-07-26 LAB — RAPID HIV SCREEN (HIV 1/2 AB+AG)

## 2022-07-26 LAB — RPR: RPR Ser Ql: NONREACTIVE

## 2022-07-26 LAB — HIV ANTIBODY (ROUTINE TESTING W REFLEX): HIV Screen 4th Generation wRfx: NONREACTIVE

## 2022-07-26 MED ORDER — CEFADROXIL 500 MG PO CAPS: 500.0000 mg | ORAL_CAPSULE | Freq: Two times a day (BID) | ORAL | 0 refills | Status: AC

## 2022-07-26 MED ORDER — CEFTRIAXONE SODIUM 500 MG IJ SOLR
500.0000 mg | Freq: Once | INTRAMUSCULAR | Status: AC
  Administered 2022-07-26: 500 mg via INTRAMUSCULAR
  Filled 2022-07-26: qty 500

## 2022-07-26 MED ORDER — DOXYCYCLINE HYCLATE 100 MG PO CAPS: 100.0000 mg | ORAL_CAPSULE | Freq: Two times a day (BID) | ORAL | 0 refills | Status: DC

## 2022-07-26 MED ORDER — DOXYCYCLINE HYCLATE 100 MG PO TABS
100.0000 mg | ORAL_TABLET | Freq: Once | ORAL | Status: AC
  Administered 2022-07-26: 100 mg via ORAL
  Filled 2022-07-26: qty 1

## 2022-07-26 MED ORDER — IMIQUIMOD 5 % EX CREA
TOPICAL_CREAM | CUTANEOUS | 0 refills | Status: DC
Start: 2022-07-27 — End: 2022-08-18

## 2022-07-26 MED ORDER — LIDOCAINE HCL (PF) 1 % IJ SOLN
1.0000 mL | Freq: Once | INTRAMUSCULAR | Status: AC
  Administered 2022-07-26: 1 mL
  Filled 2022-07-26: qty 5

## 2022-07-26 NOTE — ED Notes (Signed)
Rx x 3 given  Written and verbal inst to pt  Verbalized an understanding  To home  

## 2022-07-26 NOTE — ED Provider Notes (Signed)
[facility] DEPARTMENT Provider Note  CSN: [id number] Arrival date & time: 07/26/22 3382  Chief Complaint(s) vaginal irritation   HPI Robin Marshall is a 22 y.o. female who presents emergency department for evaluation of vaginal irritation.  Patient states that she recently became sexually active with a new partner over the last few months and noticed a new rash over the labia minora on the right.  The rash is nonpainful but appears to have popped up overnight.  She denies nausea, vomiting, chest pain, shortness of breath or other systemic symptoms.  She does endorse some vaginal discharge and states that she is just finishing her menstrual cycle.   Past Medical History Past Medical History:  Diagnosis Date   Strep pharyngitis    There are no problems to display for this patient.  Home Medication(s) Prior to Admission medications   Medication Sig Start Date End Date Taking? Authorizing Provider  cefadroxil (DURICEF) 500 MG capsule Take 1 capsule (500 mg total) by mouth 2 (two) times daily for 7 days. 07/26/22 08/02/22 Yes Tienna Bienkowski, MD  doxycycline (VIBRAMYCIN) 100 MG capsule Take 1 capsule (100 mg total) by mouth 2 (two) times daily. 07/26/22  Yes Katricia Prehn, MD  imiquimod (ALDARA) 5 % cream Apply topically 3 (three) times a week. 07/27/22  Yes Breon Rehm, MD  albuterol (VENTOLIN HFA) 108 (90 Base) MCG/ACT inhaler Inhale 2 puffs into the lungs every 4 (four) hours as needed for wheezing or shortness of breath. 07/25/20   Molpus, John, MD  cyclobenzaprine (FLEXERIL) 10 MG tablet Take 1 tablet (10 mg total) by mouth 2 (two) times daily as needed for muscle spasms. 03/11/21   Curatolo, Adam, DO  naproxen (NAPROSYN) 500 MG tablet Take 1 tablet twice daily as needed for fever or body aches. 07/25/20   Molpus, John, MD  diphenhydrAMINE (BENADRYL) 25 MG tablet Take 1 tablet (25 mg total) by mouth every 6 (six) hours. 10/12/16 07/25/20  Frederica Kuster, PA-C                                                                                                                                     Past Surgical History History reviewed. No pertinent surgical history. Family History History reviewed. No pertinent family history.  Social History Social History   Tobacco Use   Smoking status: Former    Types: E-cigarettes   Smokeless tobacco: Never  Scientific laboratory technician Use: Former  Substance Use Topics   Alcohol use: Yes    Comment: occassionally   Drug use: Yes    Types: Marijuana   Allergies Patient has no known allergies.  Review of Systems Review of Systems  Genitourinary:  Positive for vaginal bleeding and vaginal discharge.  Skin:  Positive for rash.    Physical Exam Vital Signs  I have reviewed the triage vital signs BP 119/77 (BP Location: Right Arm)   Pulse 80  Temp 98.3 F (36.8 C) (Oral)   Resp 16   Ht 5\' 2"  (1.575 m)   Wt 76.2 kg   LMP 07/21/2022 (Exact Date)   SpO2 100%   BMI 30.73 kg/m   Physical Exam Vitals and nursing note reviewed.  Constitutional:      General: She is not in acute distress.    Appearance: She is well-developed.  HENT:     Head: Normocephalic and atraumatic.  Eyes:     Conjunctiva/sclera: Conjunctivae normal.  Cardiovascular:     Rate and Rhythm: Normal rate and regular rhythm.     Heart sounds: No murmur heard. Pulmonary:     Effort: Pulmonary effort is normal. No respiratory distress.     Breath sounds: Normal breath sounds.  Abdominal:     Palpations: Abdomen is soft.     Tenderness: There is no abdominal tenderness.  Genitourinary:    Comments: Condyloma acuminata seen in the perineum, nonpainful filiform like rash on the right labia minora, scant foul-smelling discharge and cervical friability in the vaginal canal Musculoskeletal:        General: No swelling.     Cervical back: Neck supple.  Skin:    General: Skin is warm and dry.     Capillary Refill: Capillary refill takes less than 2 seconds.   Neurological:     Mental Status: She is alert.  Psychiatric:        Mood and Affect: Mood normal.     ED Results and Treatments Labs (all labs ordered are listed, but only abnormal results are displayed) Labs Reviewed  WET PREP, GENITAL - Abnormal; Notable for the following components:      Result Value   Clue Cells Wet Prep HPF POC PRESENT (*)    WBC, Wet Prep HPF POC >=10 (*)    All other components within normal limits  URINALYSIS, ROUTINE W REFLEX MICROSCOPIC - Abnormal; Notable for the following components:   APPearance HAZY (*)    Hgb urine dipstick MODERATE (*)    Leukocytes,Ua LARGE (*)    All other components within normal limits  URINALYSIS, MICROSCOPIC (REFLEX) - Abnormal; Notable for the following components:   Bacteria, UA MANY (*)    All other components within normal limits  PREGNANCY, URINE  RAPID HIV SCREEN (HIV 1/2 AB+AG)  RPR  GC/CHLAMYDIA PROBE AMP ([place]) NOT AT [facility]                                                                                                                          Radiology No results found.  Pertinent labs & imaging results that were available during my care of the patient were reviewed by me and considered in my medical decision making (see MDM for details).  Medications Ordered in ED Medications  cefTRIAXone (ROCEPHIN) injection 500 mg (500 mg Intramuscular Given 07/26/22 0609)  lidocaine (PF) (XYLOCAINE) 1 % injection 1-2.1 mL (1 mL Other Given 07/26/22 0609)  doxycycline (VIBRA-TABS) tablet  100 mg (100 mg Oral Given 07/26/22 1610)                                                                                                                                     Procedures Procedures  (including critical care time)  Medical Decision Making / ED Course   This patient presents to the ED for concern of vaginal irritation and rash, this involves an extensive number of treatment options, and is a complaint that carries with  it a high risk of complications and morbidity.  The differential diagnosis includes STI, UTI, herpes, condyloma, syphilis  MDM: Patient seen the emergency room for evaluation of vaginal irritation and rash.  Physical exam with condyloma acuminata seen in the perineum and on the right sided labia minora.  On the anterior vaginal canal, patient has some scant foul-smelling discharge with cervical friability but no white discharge.  Wet prep is positive for clue cells and white blood cells but patient is discharged does not look like bacterial vaginosis and we will hold off on Flagyl administration at this time as the patient is generally asymptomatic from this standpoint.  Patient empirically treated for gonorrhea and chlamydia with ceftriaxone and doxycycline.  Urinalysis concerning for urinary tract infection and patient will be discharged with doxycycline for continued STI therapy and Duricef for UTI.  A prescription for imiquimod cream was sent to the pharmacy for her condyloma and I placed a referral to the OB/GYN's as the patient needs to establish care.  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  Patient then discharged with outpatient OB/GYN follow-up.   Additional history obtained:  -External records from outside source obtained and reviewed including: Chart review including previous notes, labs, imaging, consultation notes   Lab Tests: -I ordered, reviewed, and interpreted labs.   The pertinent results include:   Labs Reviewed  WET PREP, GENITAL - Abnormal; Notable for the following components:      Result Value   Clue Cells Wet Prep HPF POC PRESENT (*)    WBC, Wet Prep HPF POC >=10 (*)    All other components within normal limits  URINALYSIS, ROUTINE W REFLEX MICROSCOPIC - Abnormal; Notable for the following components:   APPearance HAZY (*)     Hgb urine dipstick MODERATE (*)    Leukocytes,Ua LARGE (*)    All other components within normal limits  URINALYSIS, MICROSCOPIC (REFLEX) - Abnormal; Notable for the following components:   Bacteria, UA MANY (*)    All other components within normal limits  PREGNANCY, URINE  RAPID HIV SCREEN (HIV 1/2 AB+AG)  RPR  GC/CHLAMYDIA PROBE AMP ([place]) NOT AT [facility]     Medicines ordered and prescription drug management: Meds ordered this encounter  Medications   cefTRIAXone (ROCEPHIN) injection 500  mg    Order Specific Question:   Antibiotic Indication:    Answer:   STD   lidocaine (PF) (XYLOCAINE) 1 % injection 1-2.1 mL   doxycycline (VIBRA-TABS) tablet 100 mg   imiquimod (ALDARA) 5 % cream    Sig: Apply topically 3 (three) times a week.    Dispense:  24 each    Refill:  0   doxycycline (VIBRAMYCIN) 100 MG capsule    Sig: Take 1 capsule (100 mg total) by mouth 2 (two) times daily.    Dispense:  20 capsule    Refill:  0   cefadroxil (DURICEF) 500 MG capsule    Sig: Take 1 capsule (500 mg total) by mouth 2 (two) times daily for 7 days.    Dispense:  14 capsule    Refill:  0    -I have reviewed the patients home medicines and have made adjustments as needed  Critical interventions none   Cardiac Monitoring: The patient was maintained on a cardiac monitor.  I personally viewed and interpreted the cardiac monitored which showed an underlying rhythm of: NSR  Social Determinants of Health:  Factors impacting patients care include: Sexually active with 1 female partner   Reevaluation: After the interventions noted above, I reevaluated the patient and found that they have :improved  Co morbidities that complicate the patient evaluation  Past Medical History:  Diagnosis Date   Strep pharyngitis       Dispostion: I considered admission for this patient, but at this time she does not meet inpatient criteria for admission and is safe for discharge with outpatient  follow-up     Final Clinical Impression(s) / ED Diagnoses Final diagnoses:  Condyloma acuminata  Urinary tract infection without hematuria, site unspecified     @PCDICTATION @    Teressa Lower, MD 07/26/22 [phone]

## 2022-07-26 NOTE — ED Triage Notes (Signed)
Pt states she woke up this morning to use the restroom and had some vaginal irritation  Pt states she took a picture and notice some irritation around her vagina

## 2022-07-27 LAB — GC/CHLAMYDIA PROBE AMP (~~LOC~~) NOT AT ARMC
Chlamydia: NEGATIVE
Comment: NEGATIVE
Comment: NORMAL
Neisseria Gonorrhea: NEGATIVE

## 2022-08-01 ENCOUNTER — Other Ambulatory Visit (HOSPITAL_COMMUNITY)
Admission: RE | Admit: 2022-08-01 | Discharge: 2022-08-01 | Disposition: A | Discharge status: Home / Self Care | Payer: BC Managed Care – PPO | Source: Ambulatory Visit | Attending: Nurse Practitioner | Admitting: Nurse Practitioner

## 2022-08-01 ENCOUNTER — Ambulatory Visit: Payer: BC Managed Care – PPO | Attending: Nurse Practitioner | Admitting: Nurse Practitioner

## 2022-08-01 ENCOUNTER — Encounter: Payer: Self-pay | Admitting: Nurse Practitioner

## 2022-08-01 VITALS — BP 120/83 | HR 112 | Ht 62.0 in | Wt 168.8 lb

## 2022-08-01 DIAGNOSIS — A630 Anogenital (venereal) warts: Secondary | ICD-10-CM | POA: Insufficient documentation

## 2022-08-01 DIAGNOSIS — N939 Abnormal uterine and vaginal bleeding, unspecified: Secondary | ICD-10-CM

## 2022-08-01 DIAGNOSIS — Z7251 High risk heterosexual behavior: Secondary | ICD-10-CM

## 2022-08-01 DIAGNOSIS — B9689 Other specified bacterial agents as the cause of diseases classified elsewhere: Secondary | ICD-10-CM | POA: Insufficient documentation

## 2022-08-01 DIAGNOSIS — N760 Acute vaginitis: Secondary | ICD-10-CM | POA: Diagnosis not present

## 2022-08-01 DIAGNOSIS — Z7689 Persons encountering health services in other specified circumstances: Secondary | ICD-10-CM

## 2022-08-01 DIAGNOSIS — B3731 Acute candidiasis of vulva and vagina: Secondary | ICD-10-CM | POA: Insufficient documentation

## 2022-08-01 NOTE — Progress Notes (Signed)
Assessment & Plan:  Robin Marshall was seen today for establish care.  Diagnoses and all orders for this visit:  Encounter to establish care  High risk heterosexual behavior -     Beta hCG quant (ref lab) -     Cervicovaginal ancillary only  Abnormal uterine bleeding (AUB) -     Beta hCG quant (ref lab)    Patient has been counseled on age-appropriate routine health concerns for screening and prevention. These are reviewed and up-to-date. Referrals have been placed accordingly. Immunizations are up-to-date or declined.    Subjective:   Chief Complaint  Patient presents with   Establish Care   HPI Robin Marshall 22 y.o. female presents to office today to establish care.   She was recently evaluated in the ED 07-26-2022 and treated for condyloma acuminata and UTI after being involved in sexual activity with a new partner.  Per ED NOTE:  does not look like bacterial vaginosis and we will hold off on Flagyl administration at this time as the patient is generally asymptomatic from this standpoint. She was empirically treated for gonorrhea and chlamydia with ceftriaxone and doxycycline. Today she states she did not take doxycycline as her screening was negative for both gonorrhea and chlamydia.  PER ADDITIONAL ED NOTES: Her UA was concerning for urinary tract infection and patient was discharged with doxycycline for continued STI therapy and Duricef for UTI. A prescription for imiquimod cream was sent to the pharmacy for her condyloma and I placed a referral to the OB/GYN's as the patient needs to establish care. 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 Patient then discharged with outpatient OB/GYN follow-up.   Today she reports AUB since September and although her urine pregnancy was negative last week she would like additional testing for pregnancy. We had  her perform another self swab today as she does endorse persistent vaginal irritation and there is a vaginal odor present. She is not eligible for PAP until she turns 22. She will see Gynecology for this.    Review of Systems  Constitutional:  Negative for fever, malaise/fatigue and weight loss.  HENT: Negative.  Negative for nosebleeds.   Eyes: Negative.  Negative for blurred vision, double vision and photophobia.  Respiratory: Negative.  Negative for cough and shortness of breath.   Cardiovascular: Negative.  Negative for chest pain, palpitations and leg swelling.  Gastrointestinal: Negative.  Negative for heartburn, nausea and vomiting.  Genitourinary:        SEE HPI  Musculoskeletal: Negative.  Negative for myalgias.  Neurological: Negative.  Negative for dizziness, focal weakness, seizures and headaches.  Psychiatric/Behavioral: Negative.  Negative for suicidal ideas.     Past Medical History:  Diagnosis Date   Strep pharyngitis     History reviewed. No pertinent surgical history.  Family History  Problem Relation Age of Onset   Diabetes Mother    Hypertension Mother     Social History Reviewed with no changes to be made today.   Outpatient Medications Prior to Visit  Medication Sig Dispense Refill   cefadroxil (DURICEF) 500 MG capsule Take 1 capsule (500 mg total) by mouth 2 (two) times daily for 7 days. 14 capsule 0   imiquimod (ALDARA) 5 % cream Apply topically 3 (three) times a week. 24 each 0   cyclobenzaprine (FLEXERIL) 10 MG tablet Take 1 tablet (10 mg total)  by mouth 2 (two) times daily as needed for muscle spasms. (Patient not taking: Reported on 08/01/2022) 20 tablet 0   doxycycline (VIBRAMYCIN) 100 MG capsule Take 1 capsule (100 mg total) by mouth 2 (two) times daily. (Patient not taking: Reported on 08/01/2022) 20 capsule 0   naproxen (NAPROSYN) 500 MG tablet Take 1 tablet twice daily as needed for fever or body aches. (Patient not taking: Reported on 08/01/2022) 20  tablet 0   albuterol (VENTOLIN HFA) 108 (90 Base) MCG/ACT inhaler Inhale 2 puffs into the lungs every 4 (four) hours as needed for wheezing or shortness of breath. (Patient not taking: Reported on 08/01/2022) 18 g 0   No facility-administered medications prior to visit.    No Known Allergies     Objective:    BP 120/83   Pulse (!) 112   Ht 5\' 2"  (1.575 m)   Wt 168 lb 12.8 oz (76.6 kg)   LMP 07/21/2022 (Exact Date)   SpO2 98%   BMI 30.87 kg/m  Wt Readings from Last 3 Encounters:  08/01/22 168 lb 12.8 oz (76.6 kg)  07/26/22 168 lb (76.2 kg)  11/08/21 161 lb 1.6 oz (73.1 kg) (88 %, Z= 1.17)*   * Growth percentiles are based on CDC (Girls, 2-20 Years) data.    Physical Exam Vitals and nursing note reviewed.  Constitutional:      Appearance: She is well-developed.  HENT:     Head: Normocephalic and atraumatic.  Cardiovascular:     Rate and Rhythm: Normal rate and regular rhythm.     Heart sounds: Normal heart sounds. No murmur heard.    No friction rub. No gallop.  Pulmonary:     Effort: Pulmonary effort is normal. No tachypnea or respiratory distress.     Breath sounds: Normal breath sounds. No decreased breath sounds, wheezing, rhonchi or rales.  Chest:     Chest wall: No tenderness.  Abdominal:     General: Bowel sounds are normal.     Palpations: Abdomen is soft.  Musculoskeletal:        General: Normal range of motion.     Cervical back: Normal range of motion.  Skin:    General: Skin is warm and dry.  Neurological:     Mental Status: She is alert and oriented to person, place, and time.     Coordination: Coordination normal.  Psychiatric:        Behavior: Behavior normal. Behavior is cooperative.        Thought Content: Thought content normal.        Judgment: Judgment normal.          Patient has been counseled extensively about nutrition and exercise as well as the importance of adherence with medications and regular follow-up. The patient was given  clear instructions to go to ER or return to medical center if symptoms don't improve, worsen or new problems develop. The patient verbalized understanding.   Follow-up: Return if symptoms worsen or fail to improve.   11/10/21, FNP-BC [facility] [place], [place]   08/01/2022, 3:49 PM

## 2022-08-02 ENCOUNTER — Other Ambulatory Visit: Payer: Self-pay | Admitting: Nurse Practitioner

## 2022-08-02 LAB — BETA HCG QUANT (REF LAB): hCG Quant: <1 m[IU]/mL

## 2022-08-02 LAB — CERVICOVAGINAL ANCILLARY ONLY
Bacterial Vaginitis (gardnerella): POSITIVE — AB
Candida Glabrata: POSITIVE — AB
Candida Vaginitis: POSITIVE — AB
Chlamydia: NEGATIVE
Comment: NEGATIVE
Comment: NEGATIVE
Comment: NEGATIVE
Comment: NEGATIVE
Comment: NEGATIVE
Comment: NORMAL
Neisseria Gonorrhea: NEGATIVE
Trichomonas: NEGATIVE

## 2022-08-02 MED ORDER — FLUCONAZOLE 150 MG PO TABS: 150.0000 mg | ORAL_TABLET | Freq: Once | ORAL | 0 refills | Status: AC

## 2022-08-02 MED ORDER — METRONIDAZOLE 500 MG PO TABS: 500.0000 mg | ORAL_TABLET | Freq: Two times a day (BID) | ORAL | 0 refills | Status: DC

## 2022-08-03 ENCOUNTER — Encounter: Payer: Self-pay | Admitting: Family Medicine

## 2022-08-03 ENCOUNTER — Ambulatory Visit (INDEPENDENT_AMBULATORY_CARE_PROVIDER_SITE_OTHER): Payer: BC Managed Care – PPO | Admitting: Family Medicine

## 2022-08-03 VITALS — BP 111/71 | HR 120 | Wt 159.0 lb

## 2022-08-03 DIAGNOSIS — N898 Other specified noninflammatory disorders of vagina: Secondary | ICD-10-CM | POA: Diagnosis not present

## 2022-08-03 MED ORDER — FLUCONAZOLE 150 MG PO TABS: 150.0000 mg | ORAL_TABLET | ORAL | 0 refills | Status: AC

## 2022-08-03 MED ORDER — VALACYCLOVIR HCL 1 G PO TABS: 1000.0000 mg | ORAL_TABLET | Freq: Two times a day (BID) | ORAL | 0 refills | Status: AC

## 2022-08-03 NOTE — Progress Notes (Signed)
   Subjective:    Patient ID: Robin Marshall, female    DOB: [birth year], 22 y.o.   MRN: [id number]  HPI  22 year old G56 seen today due to vulvar swelling.  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  She was prescribed imiquimod, which she started.  She did note that the area was pruritic and she scratched the area since then, she is also had a lot of burning pain that has become worse.  She did establish care with a primary care provider 2 days ago.  At that appointment, and exam was not done.  A vaginal culture was done, showing yeast and BV.  This morning, the patient woke up and had a lot of vulvar pain and burning, worse with urination.  Review of Systems     Objective:   Physical Exam Vitals reviewed. Exam conducted with a chaperone present.  Genitourinary:    Exam position: Prone.     Comments: Patient with extensive gluteal weights discharge in the labial folds.  There are multiple ulcerations of the vulva, particularly in the interlabial folds and including on the clitoral hood.  These ulcerations extend down to the perineum.  The largest ulceration has 7 to 8 mm in diameter. Speculum exam not performed. Skin:    Capillary Refill: Capillary refill takes less than 2 seconds.  Neurological:     General: No focal deficit present.  Psychiatric:        Mood and Affect: Mood normal.        Behavior: Behavior normal.        Thought Content: Thought content normal.       Assessment & Plan:  1. 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  Culture obtained.  Will prophylactically treat with Valtrex 1000 mg twice a day.  Will also treat with Diflucan to treat underlying yeast infection.  Will hold on the imiquimod for now and revisit when everything has calm down.  Will follow-up in 1 week to reevaluate. - Herpes simplex virus culture

## 2022-08-06 LAB — HERPES SIMPLEX VIRUS CULTURE

## 2022-08-10 ENCOUNTER — Ambulatory Visit: Payer: BC Managed Care – PPO | Admitting: Family Medicine

## 2022-08-12 ENCOUNTER — Telehealth: Payer: Self-pay | Admitting: General Practice

## 2022-08-12 ENCOUNTER — Telehealth: Payer: Self-pay | Admitting: Family Medicine

## 2022-08-12 NOTE — Telephone Encounter (Signed)
Contacted by ID - requested to recheck HIV.   I contacted patient and discussed HIV labs and recommendation to repeat HIV testing.  I also asked if HSV infection was resolved and patient confirmed that it has. I discussed that HSV culture was negative, although this may be a false negative as the ulcerations were completely open. Also may be allergic reaction to imiquimod.  Will get patient scheduled for follow up to evaluate genital wart and next steps.

## 2022-08-12 NOTE — Telephone Encounter (Signed)
Left message for patient to contact our office.  Pt need to be rescheduled for genital wart recheck and HIV lab.

## 2022-08-18 ENCOUNTER — Encounter: Payer: Self-pay | Admitting: Family Medicine

## 2022-08-18 ENCOUNTER — Ambulatory Visit (INDEPENDENT_AMBULATORY_CARE_PROVIDER_SITE_OTHER): Payer: BC Managed Care – PPO | Admitting: Family Medicine

## 2022-08-18 VITALS — BP 109/76 | HR 106 | Wt 167.0 lb

## 2022-08-18 DIAGNOSIS — Z114 Encounter for screening for human immunodeficiency virus [HIV]: Secondary | ICD-10-CM

## 2022-08-18 DIAGNOSIS — A630 Anogenital (venereal) warts: Secondary | ICD-10-CM | POA: Diagnosis not present

## 2022-08-18 MED ORDER — NORETHIN ACE-ETH ESTRAD-FE 1-20 MG-MCG PO TABS: 1.0000 | ORAL_TABLET | Freq: Every day | ORAL | 3 refills | Status: AC

## 2022-08-18 MED ORDER — IMIQUIMOD 3.75 % EX CREA: 1.0000 | TOPICAL_CREAM | Freq: Every day | CUTANEOUS | 3 refills | Status: AC

## 2022-08-18 NOTE — Progress Notes (Signed)
   Subjective:    Patient ID: Robin Marshall, female    DOB: 09-Jun-2001, 22 y.o.   MRN: [id number]  HPI  Patient seen for follow-up of vaginal irritation after using Aldara for treatment of vaginal warts.  Patient had ulcerations that look like HSV.  Her HSV culture was negative.  She also had a rapid HIV test that was inconclusive.  Her HIV antibody test was negative.  She has not tried to use the Aldara cream since everything is cleared up.  Review of Systems     Objective:   Physical Exam Genitourinary:      Comments: Genital warts extending from the introitus to anus along the perineum.      Assessment & Plan:  1. Genital warts Will retry Aldara, but at a lower concentration.  Recommended applying thin coat on area, try not to get the Aldara onto normal tissue.  Will also check HSV antibodies to see if she has been exposed. - HSV(herpes simplex vrs) 1+2 ab-IgG  2. Encounter for HIV (human immunodeficiency virus) test Infectious disease recommended retesting.  Will repeat antibody - HIV antibody (with reflex)

## 2022-08-19 ENCOUNTER — Other Ambulatory Visit (INDEPENDENT_AMBULATORY_CARE_PROVIDER_SITE_OTHER): Payer: BC Managed Care – PPO

## 2022-08-19 DIAGNOSIS — Z114 Encounter for screening for human immunodeficiency virus [HIV]: Secondary | ICD-10-CM | POA: Diagnosis not present

## 2022-08-19 DIAGNOSIS — A630 Anogenital (venereal) warts: Secondary | ICD-10-CM | POA: Diagnosis not present

## 2022-08-19 DIAGNOSIS — Z0189 Encounter for other specified special examinations: Secondary | ICD-10-CM

## 2022-08-19 NOTE — Progress Notes (Signed)
Patient presents for labs. Patient was sent to the lab to have labs drawn. Marley Charlot l Marrianne Sica, CMA

## 2022-08-19 NOTE — Addendum Note (Signed)
Addended by: Wendelyn Breslow L on: 08/19/2022 10:42 AM   Modules accepted: Orders

## 2022-08-19 NOTE — Addendum Note (Signed)
Addended by: Wendelyn Breslow L on: 08/19/2022 11:55 AM   Modules accepted: Orders

## 2022-08-20 LAB — HIV ANTIBODY (ROUTINE TESTING W REFLEX): HIV Screen 4th Generation wRfx: NONREACTIVE

## 2022-08-20 LAB — HSV 1 AND 2 AB, IGG
HSV 1 Glycoprotein G Ab, IgG: 0.94 index — ABNORMAL HIGH (ref 0.00–0.90)
HSV 2 IgG, Type Spec: <0.91 index (ref 0.00–0.90)

## 2022-08-31 ENCOUNTER — Emergency Department (HOSPITAL_BASED_OUTPATIENT_CLINIC_OR_DEPARTMENT_OTHER)
Admission: EM | Admit: 2022-08-31 | Discharge: 2022-08-31 | Disposition: A | Discharge status: Home / Self Care | Payer: BC Managed Care – PPO | Attending: Emergency Medicine | Admitting: Emergency Medicine

## 2022-08-31 ENCOUNTER — Other Ambulatory Visit: Payer: Self-pay

## 2022-08-31 ENCOUNTER — Encounter (HOSPITAL_BASED_OUTPATIENT_CLINIC_OR_DEPARTMENT_OTHER): Payer: Self-pay | Admitting: Emergency Medicine

## 2022-08-31 DIAGNOSIS — T782XXA Anaphylactic shock, unspecified, initial encounter: Secondary | ICD-10-CM | POA: Diagnosis not present

## 2022-08-31 DIAGNOSIS — T7840XA Allergy, unspecified, initial encounter: Secondary | ICD-10-CM | POA: Diagnosis not present

## 2022-08-31 LAB — PREGNANCY, URINE: Preg Test, Ur: NEGATIVE

## 2022-08-31 MED ORDER — EPINEPHRINE 0.3 MG/0.3ML IJ SOAJ
INTRAMUSCULAR | Status: AC
  Filled 2022-08-31: qty 0.3

## 2022-08-31 MED ORDER — DIPHENHYDRAMINE HCL 50 MG/ML IJ SOLN
25.0000 mg | Freq: Once | INTRAMUSCULAR | Status: AC
  Administered 2022-08-31: 25 mg via INTRAVENOUS
  Filled 2022-08-31: qty 1

## 2022-08-31 MED ORDER — FAMOTIDINE IN NACL 20-0.9 MG/50ML-% IV SOLN
INTRAVENOUS | Status: AC
  Filled 2022-08-31: qty 50

## 2022-08-31 MED ORDER — EPINEPHRINE 0.3 MG/0.3ML IJ SOAJ
0.3000 mg | Freq: Once | INTRAMUSCULAR | Status: AC
  Administered 2022-08-31: 0.3 mg via INTRAMUSCULAR

## 2022-08-31 MED ORDER — PREDNISONE 20 MG PO TABS: 40.0000 mg | ORAL_TABLET | Freq: Every day | ORAL | 0 refills | Status: AC

## 2022-08-31 MED ORDER — FAMOTIDINE IN NACL 20-0.9 MG/50ML-% IV SOLN
20.0000 mg | Freq: Once | INTRAVENOUS | Status: AC
  Administered 2022-08-31: 20 mg via INTRAVENOUS
  Filled 2022-08-31: qty 50

## 2022-08-31 MED ORDER — EPINEPHRINE 0.3 MG/0.3ML IJ SOAJ: 0.3000 mg | INTRAMUSCULAR | 0 refills | Status: AC | PRN

## 2022-08-31 MED ORDER — SODIUM CHLORIDE 0.9 % IV BOLUS
500.0000 mL | Freq: Once | INTRAVENOUS | Status: AC
  Administered 2022-08-31: 500 mL via INTRAVENOUS

## 2022-08-31 MED ORDER — DEXAMETHASONE SODIUM PHOSPHATE 10 MG/ML IJ SOLN
10.0000 mg | Freq: Once | INTRAMUSCULAR | Status: AC
  Administered 2022-08-31: 10 mg via INTRAVENOUS
  Filled 2022-08-31: qty 1

## 2022-08-31 MED ORDER — DEXAMETHASONE SODIUM PHOSPHATE 10 MG/ML IJ SOLN
INTRAMUSCULAR | Status: AC
  Filled 2022-08-31: qty 1

## 2022-08-31 MED ORDER — DIPHENHYDRAMINE HCL 50 MG/ML IJ SOLN
INTRAMUSCULAR | Status: AC
  Filled 2022-08-31: qty 1

## 2022-08-31 NOTE — ED Notes (Signed)
Discharge paperwork reviewed entirely with patient, including Rx's and follow up care. Pain was under control. Pt verbalized understanding as well as all parties involved. No questions or concerns voiced at the time of discharge. No acute distress noted.   Pt ambulated out to PVA without incident or assistance.  

## 2022-08-31 NOTE — Discharge Instructions (Signed)
You were seen in the emergency department for facial swelling and rash after eating shellfish.  This is likely an allergic reaction.  You were given epinephrine and steroids and Benadryl with improvement in your symptoms.  We are prescribing you an EpiPen to use as needed in the emergency.  We are also prescribing 4 more days of prednisone.  You can use Benadryl 1 to 2 tablets every 6 hours as needed for itching.  Follow-up with your regular doctor.  Return to the emergency department if any worsening or concerning symptoms.

## 2022-08-31 NOTE — ED Triage Notes (Signed)
Mother states they went out tonight and ate Japanese  Pt had shrimp and steak  Approximately 2 hrs later pt began having hives and facial swelling

## 2022-08-31 NOTE — ED Provider Notes (Signed)
[place] [facility] Provider Note   CSN: [id number] Arrival date & time: 08/31/22  2006     History {Add pertinent medical, surgical, social history, OB history to HPI:1} Chief Complaint  Patient presents with   Allergic Reaction    Robin Marshall is a 22 y.o. female.  Has no significant past medical history.  She is brought in by her mother for possible allergic reaction.  She ate shrimp about an hour ago.  She has now had swelling around her face and diffuse itching and hives.  She does not feel any significant difficulty breathing.  No prior history of allergic reaction.  She is not on any routine medications denies chance of pregnancy.  The history is provided by the patient and a parent.  Allergic Reaction Presenting symptoms: itching, rash and swelling   Severity:  Moderate Duration:  1 hour Prior allergic episodes:  No prior episodes Context: food   Relieved by:  None tried Worsened by:  Nothing Ineffective treatments:  None tried      Home Medications Prior to Admission medications   Medication Sig Start Date End Date Taking? Authorizing Provider  doxycycline (VIBRAMYCIN) 100 MG capsule Take 1 capsule (100 mg total) by mouth 2 (two) times daily. Patient not taking: Reported on 08/01/2022 07/26/22   Kommor, Debe Coder, MD  Imiquimod 3.75 % CREA Apply 1 Application topically at bedtime. 08/18/22   Truett Mainland, DO  norethindrone-ethinyl estradiol-FE (BLISOVI FE 1/20) 1-20 MG-MCG tablet Take 1 tablet by mouth daily. 08/18/22   Truett Mainland, DO  valACYclovir (VALTREX) 1000 MG tablet Take 1 tablet (1,000 mg total) by mouth 2 (two) times daily. Take for ten days. Patient not taking: Reported on 08/18/2022 08/03/22   Truett Mainland, DO  diphenhydrAMINE (BENADRYL) 25 MG tablet Take 1 tablet (25 mg total) by mouth every 6 (six) hours. 10/12/16 07/25/20  Frederica Kuster, PA-C      Allergies    Patient has no known allergies.    Review of  Systems   Review of Systems  Constitutional:  Negative for fever.  HENT:  Negative for sore throat.   Eyes:  Negative for visual disturbance.  Respiratory:  Negative for shortness of breath.   Cardiovascular:  Negative for chest pain.  Gastrointestinal:  Negative for abdominal pain.  Genitourinary:  Negative for dysuria.  Skin:  Positive for itching and rash.    Physical Exam Updated Vital Signs There were no vitals taken for this visit. Physical Exam Vitals and nursing note reviewed.  Constitutional:      General: She is not in acute distress.    Appearance: Normal appearance. She is well-developed.  HENT:     Head: Normocephalic and atraumatic.     Comments: She has swelling of her eyelids and some hives on her face.  Tongue is normal midline no stridor Eyes:     Conjunctiva/sclera: Conjunctivae normal.  Cardiovascular:     Rate and Rhythm: Normal rate and regular rhythm.     Heart sounds: No murmur heard. Pulmonary:     Effort: Pulmonary effort is normal. No respiratory distress.     Breath sounds: Normal breath sounds.  Abdominal:     Palpations: Abdomen is soft.     Tenderness: There is no abdominal tenderness. There is no guarding or rebound.  Musculoskeletal:        General: No deformity.     Cervical back: Neck supple.  Skin:    General:  Skin is warm and dry.     Capillary Refill: Capillary refill takes less than 2 seconds.     Findings: Rash (She has hives over her trunk) present.  Neurological:     General: No focal deficit present.     Mental Status: She is alert.     Sensory: No sensory deficit.     Motor: No weakness.     ED Results / Procedures / Treatments   Labs (all labs ordered are listed, but only abnormal results are displayed) Labs Reviewed  PREGNANCY, URINE    EKG None  Radiology No results found.  Procedures Procedures  {Document cardiac monitor, telemetry assessment procedure when appropriate:1}  Medications Ordered in  ED Medications  EPINEPHrine (EPI-PEN) 0.3 mg/0.3 mL injection (has no administration in time range)  diphenhydrAMINE (BENADRYL) injection 25 mg (has no administration in time range)  EPINEPHrine (EPI-PEN) injection 0.3 mg (has no administration in time range)  dexamethasone (DECADRON) injection 10 mg (has no administration in time range)  famotidine (PEPCID) IVPB 20 mg premix (has no administration in time range)  sodium chloride 0.9 % bolus 500 mL (has no administration in time range)    ED Course/ Medical Decision Making/ A&P   {   Click here for ABCD2, HEART and other calculatorsREFRESH Note before signing :1}                          Medical Decision Making Amount and/or Complexity of Data Reviewed Labs: ordered.  Risk Prescription drug management.   This patient complains of ***; this involves an extensive number of treatment Options and is a complaint that carries with it a high risk of complications and morbidity. The differential includes ***  I ordered, reviewed and interpreted labs, which included *** I ordered medication *** and reviewed PMP when indicated. I ordered imaging studies which included *** and I independently    visualized and interpreted imaging which showed *** Additional history obtained from *** Previous records obtained and reviewed *** I consulted *** and discussed lab and imaging findings and discussed disposition.  Cardiac monitoring reviewed, *** Social determinants considered, *** Critical Interventions: ***  After the interventions stated above, I reevaluated the patient and found *** Admission and further testing considered, ***   {Document critical care time when appropriate:1} {Document review of labs and clinical decision tools ie heart score, Chads2Vasc2 etc:1}  {Document your independent review of radiology images, and any outside records:1} {Document your discussion with family members, caretakers, and with  consultants:1} {Document social determinants of health affecting pt's care:1} {Document your decision making why or why not admission, treatments were needed:1} Final Clinical Impression(s) / ED Diagnoses Final diagnoses:  None    Rx / DC Orders ED Discharge Orders     None

## 2022-09-26 ENCOUNTER — Ambulatory Visit: Payer: BC Managed Care – PPO | Admitting: Nurse Practitioner

## 2022-10-13 ENCOUNTER — Ambulatory Visit: Payer: BC Managed Care – PPO | Admitting: Family Medicine

## 2022-12-02 ENCOUNTER — Ambulatory Visit: Payer: BC Managed Care – PPO
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
## 2024-03-05 ENCOUNTER — Encounter (HOSPITAL_BASED_OUTPATIENT_CLINIC_OR_DEPARTMENT_OTHER): Payer: Self-pay | Admitting: Urology

## 2024-03-05 ENCOUNTER — Other Ambulatory Visit: Payer: Self-pay

## 2024-03-05 ENCOUNTER — Emergency Department (HOSPITAL_BASED_OUTPATIENT_CLINIC_OR_DEPARTMENT_OTHER)
Admission: EM | Admit: 2024-03-05 | Discharge: 2024-03-05 | Disposition: A | Discharge status: Home / Self Care | Payer: Self-pay

## 2024-03-05 DIAGNOSIS — S0502XA Injury of conjunctiva and corneal abrasion without foreign body, left eye, initial encounter: Secondary | ICD-10-CM | POA: Insufficient documentation

## 2024-03-05 DIAGNOSIS — X58XXXA Exposure to other specified factors, initial encounter: Secondary | ICD-10-CM | POA: Insufficient documentation

## 2024-03-05 MED ORDER — CETIRIZINE HCL 5 MG/5ML PO SOLN
10.0000 mg | Freq: Once | ORAL | Status: AC
  Administered 2024-03-05 (×2): 10 mg via ORAL
  Filled 2024-03-05: qty 10

## 2024-03-05 MED ORDER — TETRACAINE HCL 0.5 % OP SOLN
2.0000 [drp] | Freq: Once | OPHTHALMIC | Status: AC
  Administered 2024-03-05 (×2): 2 [drp] via OPHTHALMIC
  Filled 2024-03-05: qty 4

## 2024-03-05 MED ORDER — POLYMYXIN B-TRIMETHOPRIM 10000-0.1 UNIT/ML-% OP SOLN
1.0000 [drp] | Freq: Four times a day (QID) | OPHTHALMIC | Status: DC
  Administered 2024-03-05 (×2): 1 [drp] via OPHTHALMIC
  Filled 2024-03-05: qty 10

## 2024-03-05 MED ORDER — FLUORESCEIN SODIUM 1 MG OP STRP
1.0000 | ORAL_STRIP | Freq: Once | OPHTHALMIC | Status: AC
  Administered 2024-03-05 (×2): 1 via OPHTHALMIC
  Filled 2024-03-05: qty 1

## 2024-03-05 MED ORDER — IBUPROFEN 400 MG PO TABS
600.0000 mg | ORAL_TABLET | Freq: Once | ORAL | Status: AC
  Administered 2024-03-05 (×2): 600 mg via ORAL
  Filled 2024-03-05: qty 1

## 2024-03-05 NOTE — Discharge Instructions (Addendum)
 You were found to have an abrasion to the surface of your left eye.  This is likely causing your eye irritation. Avoid rubbing your eye as this can worsen the eye injury and introduce infection.   You have been given eyedrops here today to help this heal and to prevent infection.  Please apply 1 Polytrim  drop to the left eye every 6 hours for the next 5 days. You were given your first drop here today.  You may use up to 600mg  ibuprofen  every 6 hours as needed for pain.  Do not exceed 2.4g of ibuprofen  per day.  You may take 10 mg of cetirizine  (Zyrtec ) daily to help with allergic type symptoms such as eye itching and watering.  You are given your first dose here today. This is an over the counter medication.   Please follow-up with the eye doctor listed below if your symptoms are not starting to improve within the next 5 days.  Please return to the ER for worsening eye pain, white discharge from your eye, vision changes, any other new or concerning symptoms

## 2024-03-05 NOTE — ED Notes (Signed)
 Pt d/c home per EDP order. Discharge summary reviewed, verbalize understanding. NAD. Ambulatory

## 2024-03-05 NOTE — ED Triage Notes (Signed)
 Left eye lid swelling since yesterday  Was irritated yesterday after sleeping in car  Eye lid is itching and eye has been watering per pt   Does not wear contacts

## 2024-03-05 NOTE — ED Provider Notes (Signed)
 [place] [facility] Provider Note   CSN: [id number] Arrival date & time: 03/05/24  1217     Patient presents with: Eye Problem   Robin Marshall is a 23 y.o. female who presents with concern for an irritated sensation and eye watering of her left eye.  States this began 2 days ago.  She fell asleep in the car with the window open, and noticed the irritation after she woke up.  Unsure if she may have hit her eye or if anything may have gotten into it.  She does not use contact lenses.  Denies much pain, but just states that it feels irritated.  Denies any visual changes.    Eye Problem Associated symptoms: discharge        Prior to Admission medications   Medication Sig Start Date End Date Taking? Authorizing Provider  doxycycline  (VIBRAMYCIN ) 100 MG capsule Take 1 capsule (100 mg total) by mouth 2 (two) times daily. Patient not taking: Reported on 08/01/2022 07/26/22   Kommor, Lum, MD  EPINEPHrine  0.3 mg/0.3 mL IJ SOAJ injection Inject 0.3 mg into the muscle as needed for anaphylaxis. 08/31/22   Towana Ozell BROCKS, MD  Imiquimod  3.75 % CREA Apply 1 Application topically at bedtime. 08/18/22   Stinson, Jacob J, DO  norethindrone-ethinyl estradiol-FE (BLISOVI FE 1/20) 1-20 MG-MCG tablet Take 1 tablet by mouth daily. 08/18/22   Stinson, Jacob J, DO  predniSONE  (DELTASONE ) 20 MG tablet Take 2 tablets (40 mg total) by mouth daily. 08/31/22   Towana Ozell BROCKS, MD  valACYclovir  (VALTREX ) 1000 MG tablet Take 1 tablet (1,000 mg total) by mouth 2 (two) times daily. Take for ten days. Patient not taking: Reported on 08/18/2022 08/03/22   Stinson, Jacob J, DO  diphenhydrAMINE  (BENADRYL ) 25 MG tablet Take 1 tablet (25 mg total) by mouth every 6 (six) hours. 10/12/16 07/25/20  Law, Alexandra M, PA-C    Allergies: Patient has no known allergies.    Review of Systems  Eyes:  Positive for discharge.    Updated Vital Signs BP 113/78 (BP Location: Left Arm)   Pulse 99    Temp 98.5 F (36.9 C) (Oral)   Resp 14   Ht 5' 3 (1.6 m)   Wt 72.6 kg   SpO2 99%   BMI 28.35 kg/m   Physical Exam Vitals and nursing note reviewed.  Constitutional:      Appearance: Normal appearance.  HENT:     Head: Atraumatic.  Eyes:     Extraocular Movements: Extraocular movements intact.     Conjunctiva/sclera: Conjunctivae normal.     Pupils: Pupils are equal, round, and reactive to light.      Comments: Left upper and lower eyelid with mild diffuse edema.  No hordeolum visualized. Conjunctiva of the left eye is not injected or erythematous. Patient tearing out of left eye. No purulent drainage Pupils equal round reactive.  EOM intact and pain-free bilaterally.  Visual acuity 20/30 in right eye and left eye  On fluorescein  exam of the left eye, abrasion noted at the 5 o'clock position in a circular distribution.   No evidence of globe rupture, no Seidel sign  IOP attempted to be obtained, Tonopen not working  Pulmonary:     Effort: Pulmonary effort is normal.  Neurological:     General: No focal deficit present.     Mental Status: She is alert.  Psychiatric:        Mood and Affect: Mood normal.  Behavior: Behavior normal.     (all labs ordered are listed, but only abnormal results are displayed) Labs Reviewed - No data to display  EKG: None  Radiology: No results found.   Procedures   Medications Ordered in the ED  trimethoprim -polymyxin b  (POLYTRIM ) ophthalmic solution 1 drop (1 drop Left Eye Given 03/05/24 1312)  fluorescein  ophthalmic strip 1 strip (1 strip Both Eyes Given by Other 03/05/24 1234)  tetracaine  (PONTOCAINE) 0.5 % ophthalmic solution 2 drop (2 drops Both Eyes Given by Other 03/05/24 1234)  cetirizine  HCl (Zyrtec ) 5 MG/5ML solution 10 mg (10 mg Oral Given 03/05/24 1313)  ibuprofen  (ADVIL ) tablet 600 mg (600 mg Oral Given 03/05/24 1311)                                    Medical Decision Making Risk Prescription drug  management.     Differential diagnosis includes but is not limited to viral conjunctivitis, bacterial conjunctivitis, corneal abrasion, globe rupture, preseptal cellulitis, orbital cellulitis, eye trauma, hordeolum   ED Course:  Upon initial evaluation, patient is well-appearing, no acute distress.  Stable vitals.  She is having noticeable tearing out of the left eye.  No purulent drainage.  Upper and lower eyelid of the left eye with mild edema.  No erythema.  Left eye conjunctiva is healthy appearing without any injection or erythema.  Pupils equal round reactive bilaterally.  EOM is intact and pain-free bilaterally. Given edema very mild of the eyelid and no pain, lower concern for pre-septal or septal cellulitis at this time.  Visual acuity at baseline, 20/30 in the right and left eye.  On fluorescein  exam, there is a corneal abrasion noted a circular pattern at the 5 o'clock position as noted above.  Negative Seidel sign.  She is not a contact lens user.  Will treat with course of Polytrim  drops.  Also suspect there might be a component of allergic conjunctivitis given her eye itching and watering. Will also give cetirizine    Medications Given: Cetirizine  Ibuprofen  Polytrim  ophthalmic drops  Impression: Left eye corneal abrasion  Disposition:  The patient was discharged home with instructions to apply 1 drop of Polytrim  to the left eye every 6 hours for the next 5 days.  May take cetirizine  10 mg daily to help with allergic type symptoms.  Follow-up with ophthalmology Dr. McCuen if symptoms not improved within the next 5 days. Return precautions given.    This chart was dictated using voice recognition software, Dragon. Despite the best efforts of this provider to proofread and correct errors, errors may still occur which can change documentation meaning.       Final diagnoses:  Corneal abrasion, left, initial encounter    ED Discharge Orders     None           Veta Palma, PA-C 03/05/24 1317    Neysa Caron PARAS, DO 03/05/24 1353

## 2024-03-07 ENCOUNTER — Encounter (HOSPITAL_BASED_OUTPATIENT_CLINIC_OR_DEPARTMENT_OTHER): Payer: Self-pay | Admitting: Emergency Medicine

## 2024-03-07 ENCOUNTER — Emergency Department (HOSPITAL_BASED_OUTPATIENT_CLINIC_OR_DEPARTMENT_OTHER)
Admission: EM | Admit: 2024-03-07 | Discharge: 2024-03-07 | Disposition: A | Discharge status: Home / Self Care | Payer: Self-pay | Attending: Emergency Medicine | Admitting: Emergency Medicine

## 2024-03-07 ENCOUNTER — Other Ambulatory Visit: Payer: Self-pay

## 2024-03-07 DIAGNOSIS — B9689 Other specified bacterial agents as the cause of diseases classified elsewhere: Secondary | ICD-10-CM

## 2024-03-07 DIAGNOSIS — N760 Acute vaginitis: Secondary | ICD-10-CM | POA: Insufficient documentation

## 2024-03-07 DIAGNOSIS — S0502XD Injury of conjunctiva and corneal abrasion without foreign body, left eye, subsequent encounter: Secondary | ICD-10-CM

## 2024-03-07 DIAGNOSIS — H1032 Unspecified acute conjunctivitis, left eye: Secondary | ICD-10-CM | POA: Insufficient documentation

## 2024-03-07 DIAGNOSIS — X58XXXA Exposure to other specified factors, initial encounter: Secondary | ICD-10-CM | POA: Insufficient documentation

## 2024-03-07 DIAGNOSIS — B3731 Acute candidiasis of vulva and vagina: Secondary | ICD-10-CM | POA: Insufficient documentation

## 2024-03-07 DIAGNOSIS — S0502XA Injury of conjunctiva and corneal abrasion without foreign body, left eye, initial encounter: Secondary | ICD-10-CM | POA: Insufficient documentation

## 2024-03-07 DIAGNOSIS — H109 Unspecified conjunctivitis: Secondary | ICD-10-CM

## 2024-03-07 DIAGNOSIS — Z202 Contact with and (suspected) exposure to infections with a predominantly sexual mode of transmission: Secondary | ICD-10-CM | POA: Insufficient documentation

## 2024-03-07 LAB — WET PREP, GENITAL
Sperm: NONE SEEN
Trich, Wet Prep: NONE SEEN
WBC, Wet Prep HPF POC: >=10 — AB (ref <10)

## 2024-03-07 LAB — RAPID HIV SCREEN (HIV 1/2 AB+AG)
HIV 1/2 Antibodies: NONREACTIVE
HIV-1 P24 Antigen - HIV24: NONREACTIVE

## 2024-03-07 MED ORDER — TETRACAINE HCL 0.5 % OP SOLN
2.0000 [drp] | Freq: Once | OPHTHALMIC | Status: AC
  Administered 2024-03-07: 2 [drp] via OPHTHALMIC
  Filled 2024-03-07: qty 4

## 2024-03-07 MED ORDER — FLUORESCEIN SODIUM 1 MG OP STRP
2.0000 | ORAL_STRIP | Freq: Once | OPHTHALMIC | Status: AC
  Administered 2024-03-07: 2 via OPHTHALMIC
  Filled 2024-03-07: qty 2

## 2024-03-07 MED ORDER — ERYTHROMYCIN 5 MG/GM OP OINT: TOPICAL_OINTMENT | OPHTHALMIC | 0 refills | Status: AC

## 2024-03-07 MED ORDER — FLUCONAZOLE 150 MG PO TABS
150.0000 mg | ORAL_TABLET | Freq: Once | ORAL | Status: AC
  Administered 2024-03-07: 150 mg via ORAL
  Filled 2024-03-07: qty 1

## 2024-03-07 MED ORDER — METRONIDAZOLE 500 MG PO TABS: 500.0000 mg | ORAL_TABLET | Freq: Two times a day (BID) | ORAL | 0 refills | Status: DC

## 2024-03-07 NOTE — ED Triage Notes (Addendum)
 Pt reports LT eye pain on Tuesday, was seen here and dx with corneal abrasion, since then now both eyes have edema, redness, itching, and discharge  Pt also requesting STD check

## 2024-03-07 NOTE — Discharge Instructions (Signed)
 You were seen today for bacterial vaginosis, bacterial conjunctivitis, vaginal candidiasis, corneal abrasion and STD check.  I am sending you home with antibiotic ointment to place on your eyes, 4 times a day for the next 7 to 10 days.  Please follow-up with ophthalmology for further evaluation if you continue to have symptoms despite the antibiotics.  I also provided you with Diflucan  today for the yeast infection, if you begin to have any symptoms develop, please follow-up with the PCP for further evaluation.  I have also sending you home with metronidazole , take this for BV for the next 7 days, do not take this with alcohol.  Your STD testing will be done over the course next couple days, if positive they will reach out to you.

## 2024-03-07 NOTE — ED Provider Notes (Signed)
 [place] [facility] AT [facility] Provider Note   CSN: [id number] Arrival date & time: 03/07/24  1541     Patient presents with: Eye Drainage and Exposure to STD   Robin Marshall is a 23 y.o. female.   Exposure to STD  Patient is a 23 y/o female presenting to the ED with multiple complaints. First being drainage from both eyes described yellow and thick. Still using polytrim  eye drops prescribed for corneal abrasion. Had drainage in L at that time, but now has drainage in R eye  starting yesterday.  Notes that her right eye is also red become more blurry, similar to the one on the left.  Also concerned for STD exposure, but is currently asymptomatic.    Denies fever, headache, chills, chest pain, shortness of breath, dysphagia, odynophagia, abdominal pain, nausea, vomiting, vaginal discharge, vaginal bleeding, vaginal pain, dysuria, lower leg swelling.  Prior to Admission medications   Medication Sig Start Date End Date Taking? Authorizing Provider  erythromycin  ophthalmic ointment Place a 1/2 inch ribbon of ointment into the lower eyelid. 03/07/24  Yes Beola Terrall RAMAN, PA-C  metroNIDAZOLE  (FLAGYL ) 500 MG tablet Take 1 tablet (500 mg total) by mouth 2 (two) times daily. 03/07/24  Yes Beola Terrall RAMAN, PA-C  doxycycline  (VIBRAMYCIN ) 100 MG capsule Take 1 capsule (100 mg total) by mouth 2 (two) times daily. Patient not taking: Reported on 08/01/2022 07/26/22   Kommor, Lum, MD  EPINEPHrine  0.3 mg/0.3 mL IJ SOAJ injection Inject 0.3 mg into the muscle as needed for anaphylaxis. 08/31/22   Towana Ozell BROCKS, MD  Imiquimod  3.75 % CREA Apply 1 Application topically at bedtime. 08/18/22   Stinson, Jacob J, DO  norethindrone-ethinyl estradiol-FE (BLISOVI FE 1/20) 1-20 MG-MCG tablet Take 1 tablet by mouth daily. 08/18/22   Stinson, Jacob J, DO  predniSONE  (DELTASONE ) 20 MG tablet Take 2 tablets (40 mg total) by mouth daily. 08/31/22   Towana Ozell BROCKS, MD  valACYclovir  (VALTREX )  1000 MG tablet Take 1 tablet (1,000 mg total) by mouth 2 (two) times daily. Take for ten days. Patient not taking: Reported on 08/18/2022 08/03/22   Stinson, Jacob J, DO  diphenhydrAMINE  (BENADRYL ) 25 MG tablet Take 1 tablet (25 mg total) by mouth every 6 (six) hours. 10/12/16 07/25/20  Law, Alexandra M, PA-C    Allergies: Patient has no known allergies.    Review of Systems  Eyes:  Positive for photophobia, discharge and itching.  All other systems reviewed and are negative.   Updated Vital Signs BP 104/70   Pulse (!) 106   Temp 98.5 F (36.9 C) (Oral)   Resp 20   Ht 5' 3 (1.6 m)   Wt 72.6 kg   LMP 02/18/2024 (Approximate)   SpO2 100%   BMI 28.35 kg/m   Physical Exam Vitals and nursing note reviewed.  Constitutional:      General: She is not in acute distress.    Appearance: Normal appearance. She is not ill-appearing or diaphoretic.  HENT:     Head: Normocephalic and atraumatic.  Eyes:     General: Lids are normal. Lids are everted, no foreign bodies appreciated. Vision grossly intact. Gaze aligned appropriately. No visual field deficit or scleral icterus.       Right eye: No foreign body or discharge.        Left eye: No foreign body or discharge.     Intraocular pressure: Right eye pressure is 17 mmHg. Left eye pressure is 16 mmHg. Measurements were  taken using a handheld tonometer.    Extraocular Movements: Extraocular movements intact.     Right eye: Normal extraocular motion and no nystagmus.     Left eye: Normal extraocular motion and no nystagmus.     Conjunctiva/sclera:     Right eye: Right conjunctiva is injected. No chemosis, exudate or hemorrhage.    Left eye: Left conjunctiva is injected. No chemosis, exudate or hemorrhage.    Pupils: Pupils are equal, round, and reactive to light. Pupils are equal.     Right eye: Pupil is round, reactive and not sluggish. No corneal abrasion or fluorescein  uptake. Seidel exam negative.     Left eye: Pupil is round, reactive and  not sluggish. Corneal abrasion and fluorescein  uptake present. Seidel exam negative.    Visual Fields: Right eye visual fields normal and left eye visual fields normal.  Cardiovascular:     Rate and Rhythm: Normal rate and regular rhythm.     Pulses: Normal pulses.     Heart sounds: Normal heart sounds. No murmur heard.    No friction rub. No gallop.  Pulmonary:     Effort: Pulmonary effort is normal. No respiratory distress.     Breath sounds: No stridor. No wheezing, rhonchi or rales.  Chest:     Chest wall: No tenderness.  Abdominal:     General: Abdomen is flat. There is no distension.     Palpations: Abdomen is soft.     Tenderness: There is no abdominal tenderness. There is no right CVA tenderness, left CVA tenderness, guarding or rebound.  Musculoskeletal:        General: No swelling, deformity or signs of injury.     Cervical back: Normal range of motion. No rigidity.     Right lower leg: No edema.     Left lower leg: No edema.  Skin:    General: Skin is warm and dry.     Findings: No bruising, erythema or lesion.  Neurological:     General: No focal deficit present.     Mental Status: She is alert and oriented to person, place, and time. Mental status is at baseline.     Sensory: No sensory deficit.     Motor: No weakness.  Psychiatric:        Mood and Affect: Mood normal.     (all labs ordered are listed, but only abnormal results are displayed) Labs Reviewed  WET PREP, GENITAL - Abnormal; Notable for the following components:      Result Value   Yeast Wet Prep HPF POC PRESENT (*)    Clue Cells Wet Prep HPF POC PRESENT (*)    WBC, Wet Prep HPF POC >=10 (*)    All other components within normal limits  RAPID HIV SCREEN (HIV 1/2 AB+AG)  RPR  GC/CHLAMYDIA PROBE AMP ([place]) NOT AT [facility]    EKG: None  Radiology: No results found.  Procedures   Medications Ordered in the ED  fluorescein  ophthalmic strip 2 strip (2 strips Right Eye Given 03/07/24 1940)   tetracaine  (PONTOCAINE) 0.5 % ophthalmic solution 2 drop (2 drops Both Eyes Given 03/07/24 1940)  fluconazole  (DIFLUCAN ) tablet 150 mg (150 mg Oral Given 03/07/24 1950)     Medical Decision Making Amount and/or Complexity of Data Reviewed Labs: ordered.  Risk Prescription drug management.   This patient is a 23 year old female who presents to the ED for concern of multiple complaints.  Coming in today for left eye drainage and right eye  drainage, treated for corneal abrasion with eyedrops to the left eye a couple days ago.  Noted to have right eye itching and purulent drainage starting 2 days ago.  Also wishing to have STD checked today however is not experiencing any symptoms.  On physical exam, patient is in no acute distress, afebrile, alert and orient x 4, speaking in full sentences, nontachypneic, nontachycardic.  Both eyes are mildly injected, with no chemosis, no pain with EOM, EOM intact, PERRL, no foreign bodies, previously noted corneal abrasion noted to left eye, no corneal abrasion to right eye, unremarkable exam otherwise.  LCTAB, RRR, no murmur.  No lower leg swelling.  Bilateral Distance: 20/40 R Distance: 20/50 L Distance: 20/70  Pressures were normal.  With patient's current symptoms we will switch her over to Ortho mycin ointment for further relief as well as have her continue to follow-up with ophthalmology if symptoms do not abate.  Low suspicion for traumatic iritis.  Will also have her continue to take metronidazole  for the BV noted on wet prep as well as given Diflucan  today for the vaginal candidiasis.  Will have her continue follow-up PCP.  Patient vital signs have remained stable throughout the course of patient's time in the ED. Low suspicion for any other emergent pathology at this time. I believe this patient is safe to be discharged. Provided strict return to ER precautions. Patient expressed agreement and understanding of plan. All questions were  answered.  Differential diagnoses prior to evaluation: The emergent differential diagnosis includes, but is not limited to, conjunctivitis, blepharitis, allergic reaction, foreign body, cellulitis, periorbital cellulitis, retinal attachment, traumatic iritis, ocular foreign body this is not an exhaustive differential.   Past Medical History / Co-morbidities / Social History: No chronic past medical history  Additional history: Chart reviewed. Pertinent results include:   Seen on 03/05/2024 for corneal abrasion to left eye, noticing fluorescein  uptake at that time and provided cetirizine  and Polytrim  ophthalmic drops for suspected secondary allergic conjunctivitis.    Lab Tests/Imaging studies: I personally interpreted labs/imaging and the pertinent results include:   Wet prep notes yeast infection as well as clue cells.  Rapid HIV negative GC and RPR pending  Medications: I ordered medication including Diflucan , tetracaine , metronidazole .  I have reviewed the patients home medicines and have made adjustments as needed.  Critical Interventions: None  Social Determinants of Health: None  Disposition: After consideration of the diagnostic results and the patients response to treatment, I feel that the patient would benefit from discharge and treatment as above.   emergency department workup does not suggest an emergent condition requiring admission or immediate intervention beyond what has been performed at this time. The plan is: Follow-up with ophthalmology, follow-up with PCP, Diflucan  provided today, metronidazole  for BV, erythromycin  ointment for conjunctivitis. The patient is safe for discharge and has been instructed to return immediately for worsening symptoms, change in symptoms or any other concerns.   Final diagnoses:  BV (bacterial vaginosis)  Abrasion of left cornea, subsequent encounter  Bacterial conjunctivitis  Possible exposure to STD  Candidiasis, vagina    ED  Discharge Orders          Ordered    metroNIDAZOLE  (FLAGYL ) 500 MG tablet  2 times daily        03/07/24 2007    erythromycin  ophthalmic ointment        03/07/24 2010               Beola Derry Rainbow, [place] 03/07/24 2017  Francesca Elsie CROME, MD 03/07/24 2105

## 2024-03-08 LAB — RPR
RPR Ser Ql: REACTIVE — AB
RPR Titer: 1:1 {titer}

## 2024-03-08 LAB — GC/CHLAMYDIA PROBE AMP (~~LOC~~) NOT AT ARMC
Chlamydia: NEGATIVE
Comment: NEGATIVE
Comment: NORMAL
Neisseria Gonorrhea: NEGATIVE

## 2024-03-11 LAB — T.PALLIDUM AB, TOTAL: T Pallidum Abs: NONREACTIVE

## 2024-05-02 ENCOUNTER — Ambulatory Visit: Payer: Self-pay | Admitting: Family Medicine

## 2024-05-31 ENCOUNTER — Other Ambulatory Visit: Payer: Self-pay

## 2024-05-31 ENCOUNTER — Emergency Department (HOSPITAL_BASED_OUTPATIENT_CLINIC_OR_DEPARTMENT_OTHER): Payer: Self-pay

## 2024-05-31 ENCOUNTER — Emergency Department (HOSPITAL_BASED_OUTPATIENT_CLINIC_OR_DEPARTMENT_OTHER)
Admission: EM | Admit: 2024-05-31 | Discharge: 2024-05-31 | Disposition: A | Discharge status: Home / Self Care | Payer: Self-pay | Attending: Emergency Medicine | Admitting: Emergency Medicine

## 2024-05-31 ENCOUNTER — Encounter (HOSPITAL_BASED_OUTPATIENT_CLINIC_OR_DEPARTMENT_OTHER): Payer: Self-pay

## 2024-05-31 DIAGNOSIS — S92345A Nondisplaced fracture of fourth metatarsal bone, left foot, initial encounter for closed fracture: Secondary | ICD-10-CM | POA: Insufficient documentation

## 2024-05-31 DIAGNOSIS — Z79899 Other long term (current) drug therapy: Secondary | ICD-10-CM | POA: Insufficient documentation

## 2024-05-31 DIAGNOSIS — W228XXA Striking against or struck by other objects, initial encounter: Secondary | ICD-10-CM | POA: Insufficient documentation

## 2024-05-31 DIAGNOSIS — Y9341 Activity, dancing: Secondary | ICD-10-CM | POA: Insufficient documentation

## 2024-05-31 LAB — COMPREHENSIVE METABOLIC PANEL WITH GFR
ALT: 11 U/L (ref 0–44)
AST: 29 U/L (ref 15–41)
Albumin: 4.6 g/dL (ref 3.5–5.0)
Alkaline Phosphatase: 77 U/L (ref 38–126)
Anion gap: 14 (ref 5–15)
BUN: 12 mg/dL (ref 6–20)
CO2: 22 mmol/L (ref 22–32)
Calcium: 10.6 mg/dL — ABNORMAL HIGH (ref 8.9–10.3)
Chloride: 104 mmol/L (ref 98–111)
Creatinine, Ser: 0.83 mg/dL (ref 0.44–1.00)
GFR, Estimated: >60 mL/min (ref >60)
Glucose, Bld: 115 mg/dL — ABNORMAL HIGH (ref 70–99)
Potassium: 3.6 mmol/L (ref 3.5–5.1)
Sodium: 139 mmol/L (ref 135–145)
Total Bilirubin: 0.3 mg/dL (ref 0.0–1.2)
Total Protein: 8.7 g/dL — ABNORMAL HIGH (ref 6.5–8.1)

## 2024-05-31 LAB — URINALYSIS, ROUTINE W REFLEX MICROSCOPIC
Bilirubin Urine: NEGATIVE
Glucose, UA: NEGATIVE mg/dL
Hgb urine dipstick: NEGATIVE
Ketones, ur: NEGATIVE mg/dL
Leukocytes,Ua: NEGATIVE
Nitrite: NEGATIVE
Protein, ur: NEGATIVE mg/dL
Specific Gravity, Urine: 1.01 (ref 1.005–1.030)
pH: 6.5 (ref 5.0–8.0)

## 2024-05-31 LAB — CBC
HCT: 34 % — ABNORMAL LOW (ref 36.0–46.0)
Hemoglobin: 10.2 g/dL — ABNORMAL LOW (ref 12.0–15.0)
MCH: 24.2 pg — ABNORMAL LOW (ref 26.0–34.0)
MCHC: 30 g/dL (ref 30.0–36.0)
MCV: 80.6 fL (ref 80.0–100.0)
Platelets: 375 K/uL (ref 150–400)
RBC: 4.22 MIL/uL (ref 3.87–5.11)
RDW: 17.4 % — ABNORMAL HIGH (ref 11.5–15.5)
WBC: 11.1 K/uL — ABNORMAL HIGH (ref 4.0–10.5)
nRBC: 0 % (ref 0.0–0.2)

## 2024-05-31 LAB — LACTIC ACID, PLASMA: Lactic Acid, Venous: 3.1 mmol/L (ref 0.5–1.9)

## 2024-05-31 LAB — PROTIME-INR
INR: 0.9 (ref 0.8–1.2)
Prothrombin Time: 12.8 s (ref 11.4–15.2)

## 2024-05-31 LAB — PREGNANCY, URINE: Preg Test, Ur: NEGATIVE

## 2024-05-31 LAB — ETHANOL: Alcohol, Ethyl (B): 156 mg/dL — ABNORMAL HIGH (ref <15)

## 2024-05-31 MED ORDER — IOHEXOL 300 MG/ML  SOLN
100.0000 mL | Freq: Once | INTRAMUSCULAR | Status: AC | PRN
  Administered 2024-05-31: 100 mL via INTRAVENOUS

## 2024-05-31 MED ORDER — ONDANSETRON HCL 4 MG/2ML IJ SOLN
4.0000 mg | Freq: Once | INTRAMUSCULAR | Status: AC
  Administered 2024-05-31: 4 mg via INTRAVENOUS
  Filled 2024-05-31: qty 2

## 2024-05-31 MED ORDER — FENTANYL CITRATE (PF) 50 MCG/ML IJ SOSY
100.0000 ug | PREFILLED_SYRINGE | Freq: Once | INTRAMUSCULAR | Status: AC
  Administered 2024-05-31: 100 ug via INTRAVENOUS
  Filled 2024-05-31: qty 2

## 2024-05-31 MED ORDER — SODIUM CHLORIDE 0.9 % IV BOLUS
1000.0000 mL | Freq: Once | INTRAVENOUS | Status: AC
  Administered 2024-05-31: 1000 mL via INTRAVENOUS

## 2024-05-31 MED ORDER — CELECOXIB 200 MG PO CAPS: 200.0000 mg | ORAL_CAPSULE | Freq: Two times a day (BID) | ORAL | 0 refills | Status: AC
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
## 2024-05-31 NOTE — ED Notes (Signed)
 Pt returned with staff from imaging.  Noticeable amount of pain states he ankle and foot are hurting much worse since manipulation from CT and XR.  MD notified.
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
## 2024-07-15 ENCOUNTER — Encounter (HOSPITAL_BASED_OUTPATIENT_CLINIC_OR_DEPARTMENT_OTHER): Payer: Self-pay

## 2024-07-15 ENCOUNTER — Other Ambulatory Visit: Payer: Self-pay

## 2024-07-15 ENCOUNTER — Emergency Department (HOSPITAL_BASED_OUTPATIENT_CLINIC_OR_DEPARTMENT_OTHER)
Admission: EM | Admit: 2024-07-15 | Discharge: 2024-07-15 | Disposition: A | Discharge status: Home / Self Care | Payer: Self-pay | Attending: Emergency Medicine | Admitting: Emergency Medicine

## 2024-07-15 DIAGNOSIS — O26891 Other specified pregnancy related conditions, first trimester: Secondary | ICD-10-CM | POA: Insufficient documentation

## 2024-07-15 DIAGNOSIS — R110 Nausea: Secondary | ICD-10-CM | POA: Insufficient documentation

## 2024-07-15 DIAGNOSIS — Z3A01 Less than 8 weeks gestation of pregnancy: Secondary | ICD-10-CM | POA: Insufficient documentation

## 2024-07-15 DIAGNOSIS — Z79899 Other long term (current) drug therapy: Secondary | ICD-10-CM | POA: Insufficient documentation

## 2024-07-15 LAB — URINALYSIS, MICROSCOPIC (REFLEX)

## 2024-07-15 LAB — URINALYSIS, ROUTINE W REFLEX MICROSCOPIC
Glucose, UA: NEGATIVE mg/dL
Hgb urine dipstick: NEGATIVE
Ketones, ur: NEGATIVE mg/dL
Nitrite: NEGATIVE
Protein, ur: 100 mg/dL — AB
Specific Gravity, Urine: 1.025 (ref 1.005–1.030)
pH: 6.5 (ref 5.0–8.0)

## 2024-07-15 LAB — HCG, QUANTITATIVE, PREGNANCY: hCG, Beta Chain, Quant, S: 3664 m[IU]/mL — ABNORMAL HIGH

## 2024-07-15 LAB — PREGNANCY, URINE: Preg Test, Ur: POSITIVE — AB

## 2024-07-15 NOTE — ED Triage Notes (Addendum)
 Pt states that she took a pregnancy test at home and the lines were faint for a + result. Wants another test to confirm. States that she has been having some nausea

## 2024-07-15 NOTE — Discharge Instructions (Addendum)
 Your pregnancy test here in the emergency department was positive.  Your hCG was at 3000 which indicates approximately [redacted] weeks pregnant.  It is advised that you follow-up with OB/GYN in the next couple days for confirmation visit and to get established for care.  If you experience any concerning symptoms please return to the ED for further evaluation.  Symptoms would include fainting, vaginal bleeding, severe abdominal pain, or shortness of breath.

## 2024-07-15 NOTE — ED Provider Notes (Signed)
 "  EMERGENCY DEPARTMENT AT [facility] Provider Note   CSN: [id number] Arrival date & time: 07/15/24  1336     Patient presents with: Possible Pregnancy   Robin Marshall is a 23 y.o. female.  23 year old female presents to ED with concern for positive pregnancy test at home.  She reports she took a pregnancy test at home and advised that the lines were faintly positive.  Patient reports her last menstrual cycle was November 17 and on December 12 she started having cramps.  She has had some associated nausea and reports some tenderness in her breast.  Denies headache, dizziness, vaginal bleeding, vomiting, syncope, urinary symptoms.  Reports she has never been pregnant before.  She is requesting repeat pregnancy test.     Prior to Admission medications  Medication Sig Start Date End Date Taking? Authorizing Provider  celecoxib  (CELEBREX ) 200 MG capsule Take 1 capsule (200 mg total) by mouth 2 (two) times daily. 05/31/24   Jerral Meth, MD  doxycycline  (VIBRAMYCIN ) 100 MG capsule Take 1 capsule (100 mg total) by mouth 2 (two) times daily. Patient not taking: Reported on 08/01/2022 07/26/22   Kommor, Lum, MD  EPINEPHrine  0.3 mg/0.3 mL IJ SOAJ injection Inject 0.3 mg into the muscle as needed for anaphylaxis. 08/31/22   Towana Ozell BROCKS, MD  erythromycin  ophthalmic ointment Place a 1/2 inch ribbon of ointment into the lower eyelid. 03/07/24   Bauer, Collin S, PA-C  Imiquimod  3.75 % CREA Apply 1 Application topically at bedtime. 08/18/22   Stinson, Jacob J, DO  metroNIDAZOLE  (FLAGYL ) 500 MG tablet Take 1 tablet (500 mg total) by mouth 2 (two) times daily. 03/07/24   Bauer, Collin S, PA-C  norethindrone-ethinyl estradiol-FE (BLISOVI FE 1/20) 1-20 MG-MCG tablet Take 1 tablet by mouth daily. 08/18/22   Stinson, Jacob J, DO  predniSONE  (DELTASONE ) 20 MG tablet Take 2 tablets (40 mg total) by mouth daily. 08/31/22   Towana Ozell BROCKS, MD  valACYclovir  (VALTREX ) 1000 MG tablet Take 1  tablet (1,000 mg total) by mouth 2 (two) times daily. Take for ten days. Patient not taking: Reported on 08/18/2022 08/03/22   Stinson, Jacob J, DO  diphenhydrAMINE  (BENADRYL ) 25 MG tablet Take 1 tablet (25 mg total) by mouth every 6 (six) hours. 10/12/16 07/25/20  Law, Alexandra M, PA-C    Allergies: Patient has no known allergies.    Review of Systems  Gastrointestinal:  Positive for nausea.  All other systems reviewed and are negative.   Updated Vital Signs BP 128/80   Pulse (!) 108   Temp 98 F (36.7 C)   Resp 18   Ht 5' 3 (1.6 m)   Wt 77.1 kg   LMP 06/10/2024 Comment: took test at home this morning.  SpO2 100%   Breastfeeding Unknown   BMI 30.11 kg/m   Physical Exam Vitals and nursing note reviewed.  Constitutional:      Appearance: Normal appearance.  HENT:     Head: Normocephalic and atraumatic.     Nose: Nose normal.  Eyes:     Extraocular Movements: Extraocular movements intact.     Conjunctiva/sclera: Conjunctivae normal.     Pupils: Pupils are equal, round, and reactive to light.  Cardiovascular:     Rate and Rhythm: Normal rate.  Pulmonary:     Effort: Pulmonary effort is normal. No respiratory distress.     Breath sounds: Normal breath sounds.     Comments: Lungs are clear to auscultation all fields Abdominal:  General: Abdomen is flat.     Palpations: Abdomen is soft.     Tenderness: There is no abdominal tenderness. There is no guarding.     Comments: No pain to palpation throughout abdomen.   Musculoskeletal:        General: Normal range of motion.     Cervical back: Normal range of motion.  Skin:    General: Skin is warm.     Capillary Refill: Capillary refill takes less than 2 seconds.  Neurological:     General: No focal deficit present.     Mental Status: She is alert.  Psychiatric:        Mood and Affect: Mood normal.        Behavior: Behavior normal.     (all labs ordered are listed, but only abnormal results are displayed) Labs  Reviewed  URINALYSIS, ROUTINE W REFLEX MICROSCOPIC - Abnormal; Notable for the following components:      Result Value   APPearance HAZY (*)    Bilirubin Urine SMALL (*)    Protein, ur 100 (*)    Leukocytes,Ua TRACE (*)    All other components within normal limits  PREGNANCY, URINE - Abnormal; Notable for the following components:   Preg Test, Ur POSITIVE (*)    All other components within normal limits  HCG, QUANTITATIVE, PREGNANCY - Abnormal; Notable for the following components:   hCG, Beta Chain, Quant, S 3,664 (*)    All other components within normal limits  URINALYSIS, MICROSCOPIC (REFLEX) - Abnormal; Notable for the following components:   Bacteria, UA RARE (*)    All other components within normal limits    EKG: None  Radiology: No results found.   Procedures   Medications Ordered in the ED - No data to display  23 y.o. female presents to the ED with request for repeat pregnancy test.  On arrival pt is nontoxic, vitals significant for tachycardia. Exam is unremarkable.  Lab Tests:  UA and urine pregnancy ordered in triage.  I ordered hCG quantitative after positive urine.  ED Course:   Patient complaining of breast tenderness with associated nausea and for 6 days past her next expected period.  Urine pregnancy was positive in triage.  After discussion with patient it was decided to do hCG quantitative with OB/GYN follow-up this week.  This will be patient's first pregnancy and denies any severe suprapubic cramping or discharge/vaginal bleeding.  Patient's heart rate was noted to be 105 and 108 on repeat vitals.  Patient's Wells score is 1.5 due to HR denies any other associated symptoms.  Patient does appear very anxious over the news of pregnancy. PE is not of high concern at this point.  Symptoms were discussed with patient if she began experiencing shortness of breath chest pain leg swelling dizziness or syncope.  Patient was advised to follow-up with a OB/GYN  this week or next to have hCG rechecked and confirmation visit.  Patient agreed with treatment plan and is comfortable discharge at this time.    Portions of this note were generated with Scientist, clinical (histocompatibility and immunogenetics). Dictation errors may occur despite best attempts at proofreading.   Final diagnoses:  Less than [redacted] weeks gestation of pregnancy    ED Discharge Orders     None          Myriam Fonda GORMAN DEVONNA 07/15/24 2126    Randol Simmonds, MD 07/18/24 [phone]  "

## 2024-07-31 ENCOUNTER — Other Ambulatory Visit: Payer: Self-pay

## 2024-07-31 ENCOUNTER — Ambulatory Visit (INDEPENDENT_AMBULATORY_CARE_PROVIDER_SITE_OTHER): Payer: Self-pay

## 2024-07-31 ENCOUNTER — Other Ambulatory Visit (HOSPITAL_COMMUNITY)
Admission: RE | Admit: 2024-07-31 | Discharge: 2024-07-31 | Disposition: A | Discharge status: Home / Self Care | Payer: Self-pay | Source: Ambulatory Visit | Attending: Family Medicine | Admitting: Family Medicine

## 2024-07-31 VITALS — BP 131/85 | HR 121 | Wt 175.0 lb

## 2024-07-31 DIAGNOSIS — Z3A08 8 weeks gestation of pregnancy: Secondary | ICD-10-CM

## 2024-07-31 DIAGNOSIS — Z3401 Encounter for supervision of normal first pregnancy, first trimester: Secondary | ICD-10-CM

## 2024-07-31 NOTE — Progress Notes (Signed)
 New OB Intake  I explained I am completing New OB Intake today. We discussed EDD of Not found.. Pt is G2P0000. I reviewed her allergies, medications and Medical/Surgical/OB history.    Patient Active Problem List   Diagnosis Date Noted   Encounter for supervision of normal first pregnancy in first trimester 07/31/2024    Concerns addressed today  Patient informed that the ultrasound is considered a limited obstetric ultrasound and is not intended to be a complete ultrasound exam.  Patient also informed that the ultrasound is not being completed with the intent of assessing for fetal or placental anomalies or any pelvic abnormalities. Explained that the purpose of today's ultrasound is to assess for viability.  Patient acknowledges the purpose of the exam and the limitations of the study.     Delivery Plans Plans to deliver at [facility]. Discussed the nature of our practice with multiple providers including residents and students. Due to the size of the practice, the delivering provider may not be the same as those providing prenatal care.   MyChart/Babyscripts MyChart access verified. I explained pt will have some visits in office and some virtually. Babyscripts app discussed and ordered.   Blood Pressure Cuff Blood pressure cuff discussed.  Discussed to be used for virtual visits and or if needed BP checks weekly.  Anatomy US  Explained first scheduled US  will be around 19 weeks.   Last Pap No results found for: DIAGPAP  First visit review I reviewed new OB appt with patient. Explained pt will be seen by Dr. Barbra at first visit. Discussed [place] genetic screening with patient and friend. Routine prenatal labs ordered.    Erminio DELENA Rumps, [place] 07/31/2024  9:59 AM

## 2024-08-01 LAB — CBC/D/PLT+RPR+RH+ABO+RUBIGG...
Antibody Screen: NEGATIVE
Basophils Absolute: 0.1 x10E3/uL (ref 0.0–0.2)
Basos: 1 %
EOS (ABSOLUTE): 0.1 x10E3/uL (ref 0.0–0.4)
Eos: 1 %
HCV Ab: NONREACTIVE
HIV Screen 4th Generation wRfx: NONREACTIVE
Hematocrit: 33.3 % — ABNORMAL LOW (ref 34.0–46.6)
Hemoglobin: 9.8 g/dL — ABNORMAL LOW (ref 11.1–15.9)
Hepatitis B Surface Ag: NEGATIVE
Immature Grans (Abs): 0 x10E3/uL (ref 0.0–0.1)
Immature Granulocytes: 0 %
Lymphocytes Absolute: 2.4 x10E3/uL (ref 0.7–3.1)
Lymphs: 27 %
MCH: 23.9 pg — ABNORMAL LOW (ref 26.6–33.0)
MCHC: 29.4 g/dL — ABNORMAL LOW (ref 31.5–35.7)
MCV: 81 fL (ref 79–97)
Monocytes Absolute: 0.6 x10E3/uL (ref 0.1–0.9)
Monocytes: 6 %
Neutrophils Absolute: 5.8 x10E3/uL (ref 1.4–7.0)
Neutrophils: 65 %
Platelets: 382 x10E3/uL (ref 150–450)
RBC: 4.1 x10E6/uL (ref 3.77–5.28)
RDW: 15.8 % — ABNORMAL HIGH (ref 11.7–15.4)
RPR Ser Ql: NONREACTIVE
Rh Factor: POSITIVE
Rubella Antibodies, IGG: 2.05 {index}
WBC: 8.9 x10E3/uL (ref 3.4–10.8)

## 2024-08-01 LAB — CERVICOVAGINAL ANCILLARY ONLY
Chlamydia: NEGATIVE
Comment: NEGATIVE
Comment: NORMAL
Neisseria Gonorrhea: NEGATIVE

## 2024-08-01 LAB — HCV INTERPRETATION

## 2024-08-04 LAB — URINE CULTURE, OB REFLEX

## 2024-08-04 LAB — CULTURE, OB URINE

## 2024-08-05 ENCOUNTER — Ambulatory Visit: Payer: Self-pay | Admitting: Family Medicine

## 2024-08-05 DIAGNOSIS — Z3401 Encounter for supervision of normal first pregnancy, first trimester: Secondary | ICD-10-CM

## 2024-08-05 MED ORDER — CEFADROXIL 500 MG PO CAPS: 500.0000 mg | ORAL_CAPSULE | Freq: Two times a day (BID) | ORAL | 0 refills | Status: AC

## 2024-08-07 ENCOUNTER — Other Ambulatory Visit: Payer: Self-pay

## 2024-08-07 ENCOUNTER — Other Ambulatory Visit: Payer: Self-pay | Admitting: Family Medicine

## 2024-08-07 DIAGNOSIS — Z3A08 8 weeks gestation of pregnancy: Secondary | ICD-10-CM

## 2024-08-07 DIAGNOSIS — Z3401 Encounter for supervision of normal first pregnancy, first trimester: Secondary | ICD-10-CM

## 2024-09-04 ENCOUNTER — Encounter: Payer: Self-pay | Admitting: Obstetrics and Gynecology
